# Patient Record
Sex: Female | Born: 1970 | Race: Black or African American | Hispanic: No | Smoking: Never smoker
Health system: Southern US, Community
[De-identification: ages and names within clinical notes are randomized; demographics above are authoritative.]

## PROBLEM LIST (undated history)

## (undated) DIAGNOSIS — R51 Headache: Secondary | ICD-10-CM

## (undated) DIAGNOSIS — D649 Anemia, unspecified: Secondary | ICD-10-CM

## (undated) DIAGNOSIS — R519 Headache, unspecified: Secondary | ICD-10-CM

## (undated) DIAGNOSIS — G500 Trigeminal neuralgia: Secondary | ICD-10-CM

## (undated) HISTORY — DX: Trigeminal neuralgia: G50.0

## (undated) HISTORY — DX: Headache, unspecified: R51.9

## (undated) HISTORY — DX: Headache: R51

## (undated) HISTORY — PX: TUBAL LIGATION: SHX77

## (undated) HISTORY — DX: Anemia, unspecified: D64.9

---

## 2001-04-03 ENCOUNTER — Other Ambulatory Visit: Admission: RE | Admit: 2001-04-03 | Discharge: 2001-04-03 | Payer: Self-pay | Admitting: Family Medicine

## 2002-04-02 ENCOUNTER — Emergency Department (HOSPITAL_COMMUNITY): Admission: EM | Admit: 2002-04-02 | Discharge: 2002-04-02 | Payer: Self-pay | Admitting: Emergency Medicine

## 2002-08-30 ENCOUNTER — Encounter: Payer: Self-pay | Admitting: Emergency Medicine

## 2002-08-31 ENCOUNTER — Inpatient Hospital Stay (HOSPITAL_COMMUNITY): Admission: EM | Admit: 2002-08-31 | Discharge: 2002-09-04 | Payer: Self-pay | Admitting: Emergency Medicine

## 2002-08-31 ENCOUNTER — Encounter: Payer: Self-pay | Admitting: Internal Medicine

## 2002-09-01 ENCOUNTER — Encounter: Payer: Self-pay | Admitting: General Surgery

## 2002-09-02 ENCOUNTER — Encounter: Payer: Self-pay | Admitting: General Surgery

## 2002-09-10 ENCOUNTER — Encounter: Payer: Self-pay | Admitting: Internal Medicine

## 2002-09-10 ENCOUNTER — Ambulatory Visit (HOSPITAL_COMMUNITY): Admission: RE | Admit: 2002-09-10 | Discharge: 2002-09-10 | Payer: Self-pay | Admitting: Internal Medicine

## 2002-11-08 ENCOUNTER — Encounter: Payer: Self-pay | Admitting: Internal Medicine

## 2002-11-08 ENCOUNTER — Ambulatory Visit (HOSPITAL_COMMUNITY): Admission: RE | Admit: 2002-11-08 | Discharge: 2002-11-08 | Payer: Self-pay | Admitting: Internal Medicine

## 2004-01-19 ENCOUNTER — Ambulatory Visit (HOSPITAL_COMMUNITY): Admission: RE | Admit: 2004-01-19 | Discharge: 2004-01-19 | Payer: Self-pay | Admitting: Family Medicine

## 2013-12-30 ENCOUNTER — Other Ambulatory Visit: Payer: Self-pay

## 2013-12-30 DIAGNOSIS — Z1231 Encounter for screening mammogram for malignant neoplasm of breast: Secondary | ICD-10-CM

## 2014-02-07 ENCOUNTER — Ambulatory Visit: Payer: Self-pay

## 2014-03-30 ENCOUNTER — Encounter: Payer: Self-pay | Admitting: *Deleted

## 2014-04-19 ENCOUNTER — Ambulatory Visit: Payer: PRIVATE HEALTH INSURANCE | Admitting: Family Medicine

## 2014-05-24 ENCOUNTER — Ambulatory Visit (INDEPENDENT_AMBULATORY_CARE_PROVIDER_SITE_OTHER): Payer: PRIVATE HEALTH INSURANCE | Admitting: Family Medicine

## 2014-05-24 ENCOUNTER — Encounter: Payer: Self-pay | Admitting: Family Medicine

## 2014-05-24 VITALS — BP 130/82 | HR 78 | Temp 98.3°F | Resp 12 | Ht 69.0 in | Wt 281.0 lb

## 2014-05-24 DIAGNOSIS — E559 Vitamin D deficiency, unspecified: Secondary | ICD-10-CM

## 2014-05-24 DIAGNOSIS — K219 Gastro-esophageal reflux disease without esophagitis: Secondary | ICD-10-CM | POA: Insufficient documentation

## 2014-05-24 DIAGNOSIS — E669 Obesity, unspecified: Secondary | ICD-10-CM

## 2014-05-24 DIAGNOSIS — R7301 Impaired fasting glucose: Secondary | ICD-10-CM

## 2014-05-24 DIAGNOSIS — Z1231 Encounter for screening mammogram for malignant neoplasm of breast: Secondary | ICD-10-CM

## 2014-05-24 DIAGNOSIS — R0789 Other chest pain: Secondary | ICD-10-CM

## 2014-05-24 DIAGNOSIS — Z0000 Encounter for general adult medical examination without abnormal findings: Secondary | ICD-10-CM

## 2014-05-24 LAB — COMPREHENSIVE METABOLIC PANEL
ALT: 12 U/L (ref 0–35)
AST: 14 U/L (ref 0–37)
Albumin: 3.9 g/dL (ref 3.5–5.2)
Alkaline Phosphatase: 51 U/L (ref 39–117)
BILIRUBIN TOTAL: 0.4 mg/dL (ref 0.2–1.2)
BUN: 11 mg/dL (ref 6–23)
CO2: 27 mEq/L (ref 19–32)
CREATININE: 0.81 mg/dL (ref 0.50–1.10)
Calcium: 9.6 mg/dL (ref 8.4–10.5)
Chloride: 103 mEq/L (ref 96–112)
GLUCOSE: 84 mg/dL (ref 70–99)
Potassium: 4.6 mEq/L (ref 3.5–5.3)
Sodium: 138 mEq/L (ref 135–145)
Total Protein: 7.2 g/dL (ref 6.0–8.3)

## 2014-05-24 LAB — CBC WITH DIFFERENTIAL/PLATELET
Basophils Absolute: 0.1 10*3/uL (ref 0.0–0.1)
Basophils Relative: 1 % (ref 0–1)
EOS ABS: 0 10*3/uL (ref 0.0–0.7)
EOS PCT: 0 % (ref 0–5)
HCT: 34.6 % — ABNORMAL LOW (ref 36.0–46.0)
Hemoglobin: 12 g/dL (ref 12.0–15.0)
Lymphocytes Relative: 23 % (ref 12–46)
Lymphs Abs: 1.3 10*3/uL (ref 0.7–4.0)
MCH: 31.4 pg (ref 26.0–34.0)
MCHC: 34.7 g/dL (ref 30.0–36.0)
MCV: 90.6 fL (ref 78.0–100.0)
Monocytes Absolute: 0.3 10*3/uL (ref 0.1–1.0)
Monocytes Relative: 5 % (ref 3–12)
Neutro Abs: 4 10*3/uL (ref 1.7–7.7)
Neutrophils Relative %: 71 % (ref 43–77)
PLATELETS: 331 10*3/uL (ref 150–400)
RBC: 3.82 MIL/uL — AB (ref 3.87–5.11)
RDW: 13.5 % (ref 11.5–15.5)
WBC: 5.7 10*3/uL (ref 4.0–10.5)

## 2014-05-24 LAB — LIPID PANEL
Cholesterol: 144 mg/dL (ref 0–200)
HDL: 65 mg/dL (ref >39)
LDL Cholesterol: 68 mg/dL (ref 0–99)
TRIGLYCERIDES: 57 mg/dL (ref <150)
Total CHOL/HDL Ratio: 2.2 Ratio
VLDL: 11 mg/dL (ref 0–40)

## 2014-05-24 LAB — HEMOGLOBIN A1C
HEMOGLOBIN A1C: 5.3 % (ref <5.7)
MEAN PLASMA GLUCOSE: 105 mg/dL (ref <117)

## 2014-05-24 NOTE — Progress Notes (Signed)
Patient ID: Beverly Mclaughlin, female   DOB: June 06, 1971, 43 y.o.   MRN: [id number]   Subjective:    Patient ID: Beverly Guntheramar Spieler, female    DOB: June 06, 1971, 43 y.o.   MRN: [id number]  Patient presents for New Patient CPE  patient here for new physical exam. Her previous PCP was Dr. Mirna MiresGerald Hill she is [facility] [place]. Her Pap smear and mammogram are up-to-date. She did have a D&C secondary to a polyp but this was benign she also has 2 small fibroids.  Obesity she has lost about 20 pounds over the past year her heaviest is 301 pounds. She exercises 3 times a week and is change her diet. She has had fasting labs last year with her job which is slightly elevated glucose of 110 she is due for repeat labs today. She also has a history of vitamin D deficiency and some mild anemia which was treated both with vitamin therapy   Her only complaint today is Naprosyn chest pain last week she's had acid reflux in the past but was not sure of this was reflux or not she was concerned because of a family history of heart disease. The pain felt like more pressure in the middle of her chest and went up into her esophagus region she's not having shortness of breath diaphoresis nausea vomiting associated with very short lived. In the past she has taken over-the-counter medications such as Prilosec.  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    Review Of Systems:  GEN- denies fatigue, fever, weight loss,weakness, recent illness HEENT- denies eye drainage, change in vision, nasal discharge, CVS- denies chest pain, palpitations RESP- denies SOB, cough, wheeze ABD- denies N/V, change in stools, abd pain GU- denies dysuria, hematuria, dribbling, incontinence MSK- denies joint pain, muscle aches, injury Neuro- denies headache, dizziness, syncope, seizure activity       Objective:    BP 130/82  Pulse 78   Temp(Src) 98.3 F (36.8 C) (Oral)  Resp 12  Ht 5\' 9"  (1.753 m)  Wt 281 lb (127.461 kg)  BMI 41.48 kg/m2 GEN- NAD, alert and oriented x3 HEENT- PERRL, EOMI, non injected sclera, pink conjunctiva, MMM, oropharynx clear, TM clear bilat , canals clear Neck- Supple, no thyromegaly CVS- RRR, no murmur RESP-CTAB ABD-NABS,soft,NT,ND EXT- No edema Pulses- Radial, DP- 2+ Psych- normal affect and mood  EKG-NSR, no ST changes       Assessment & Plan:      Problem List Items Addressed This Visit   None      Note: This dictation was prepared with Dragon dictation along with smaller phrase technology. Any transcriptional errors that result from this process are unintentional.

## 2014-05-24 NOTE — Assessment & Plan Note (Signed)
GYN deferred I will obtain records Immunizations are up to date she will get flu shot in the fall Fasting labs to be done today she will continue to work on her weight loss with her dietary changes. Continue vitamin supplement She has establish with the dentist as well as an eye doctor

## 2014-05-24 NOTE — Assessment & Plan Note (Signed)
Check A1C 

## 2014-05-24 NOTE — Patient Instructions (Signed)
We will call with lab results if abnormal I recommend eye visit once a year I recommend dental visit every 6 months Goal is to  Exercise 30 minutes 5 days a week Try the over the counter Zantac or prilosec F/U as needed or in 1 year

## 2014-05-24 NOTE — Assessment & Plan Note (Signed)
I think her chest pain is secondary to acid reflux was a very short-lived episode. She can use over-the-counter medications

## 2014-05-25 ENCOUNTER — Encounter: Payer: Self-pay | Admitting: *Deleted

## 2014-05-25 LAB — VITAMIN D 25 HYDROXY (VIT D DEFICIENCY, FRACTURES): Vit D, 25-Hydroxy: 40 ng/mL (ref 30–89)

## 2014-06-13 ENCOUNTER — Ambulatory Visit
Admission: RE | Admit: 2014-06-13 | Discharge: 2014-06-13 | Disposition: A | Discharge status: Home / Self Care | Payer: PRIVATE HEALTH INSURANCE | Source: Ambulatory Visit | Attending: Family Medicine | Admitting: Family Medicine

## 2014-06-13 ENCOUNTER — Ambulatory Visit: Payer: Self-pay

## 2014-06-13 DIAGNOSIS — Z1231 Encounter for screening mammogram for malignant neoplasm of breast: Secondary | ICD-10-CM

## 2014-06-21 ENCOUNTER — Encounter: Payer: Self-pay | Admitting: Family Medicine

## 2014-09-16 ENCOUNTER — Ambulatory Visit: Payer: PRIVATE HEALTH INSURANCE | Admitting: Family Medicine

## 2014-10-06 ENCOUNTER — Ambulatory Visit (INDEPENDENT_AMBULATORY_CARE_PROVIDER_SITE_OTHER): Payer: PRIVATE HEALTH INSURANCE | Admitting: Physician Assistant

## 2014-10-06 ENCOUNTER — Encounter: Payer: Self-pay | Admitting: Physician Assistant

## 2014-10-06 DIAGNOSIS — R8299 Other abnormal findings in urine: Secondary | ICD-10-CM

## 2014-10-06 DIAGNOSIS — Z23 Encounter for immunization: Secondary | ICD-10-CM

## 2014-10-06 DIAGNOSIS — R8290 Unspecified abnormal findings in urine: Secondary | ICD-10-CM

## 2014-10-06 LAB — URINALYSIS, ROUTINE W REFLEX MICROSCOPIC
BILIRUBIN URINE: NEGATIVE
Glucose, UA: NEGATIVE mg/dL
HGB URINE DIPSTICK: NEGATIVE
Leukocytes, UA: NEGATIVE
Nitrite: NEGATIVE
PH: 6 (ref 5.0–8.0)
PROTEIN: NEGATIVE mg/dL
Specific Gravity, Urine: 1.01 (ref 1.005–1.030)
Urobilinogen, UA: 0.2 mg/dL (ref 0.0–1.0)

## 2014-10-06 NOTE — Progress Notes (Signed)
    Patient ID: Beverly Mclaughlin MRN: [id number], DOB: 1971/01/13, 43 y.o. Date of Encounter: 10/06/2014, 3:52 PM    Chief Complaint:  Chief Complaint  Patient presents with  . cloudy urine mild dysuria     HPI: 43 y.o. year old AA Obese female says that when she urinated today she noticed it looked a little bit cloudy. Says she really has had no discomfort. No dysuria. No vaginal irritation or itching. No vaginal discharge. Is sexuyally active but in relationship with one person long time. No pelvic pain, no fever/chills.  Said she just wanted to check to make sure no urine infection.      Home Meds:   Outpatient Prescriptions Prior to Visit  Medication Sig Dispense Refill  . ibuprofen (ADVIL,MOTRIN) 200 MG tablet Take 200 mg by mouth every 6 (six) hours as needed.    . Multiple Vitamins-Calcium (ONE-A-DAY WOMENS FORMULA) TABS Take 1 tablet by mouth every morning.     No facility-administered medications prior to visit.    Allergies: No Known Allergies    Review of Systems: See HPI for pertinent ROS. All other ROS negative.    Physical Exam: Blood pressure 138/96, pulse 92, temperature 97.9 F (36.6 C), temperature source Oral, resp. rate 18, weight 270 lb (122.471 kg)., Body mass index is 39.85 kg/(m^2). General:  Obese AAF. Appears in no acute distress. Neck: Supple. No thyromegaly. No lymphadenopathy. Lungs: Clear bilaterally to auscultation without wheezes, rales, or rhonchi. Breathing is unlabored. Heart: Regular rhythm. No murmurs, rubs, or gallops. Abdomen: Soft, non-tender, non-distended with normoactive bowel sounds. No hepatomegaly. No rebound/guarding. No obvious abdominal masses. Msk:  Strength and tone normal for age. No tenderness with percussion of costophrenic angles bilaterally.  Extremities/Skin: Warm and dry. Neuro: Alert and oriented X 3. Moves all extremities spontaneously. Gait is normal. CNII-XII grossly in tact. Psych:  Responds to questions  appropriately with a normal affect.   Results for orders placed or performed in visit on 10/06/14  Urinalysis, Routine w reflex microscopic  Result Value Ref Range   Color, Urine YELLOW YELLOW   APPearance CLEAR CLEAR   Specific Gravity, Urine 1.010 1.005 - 1.030   pH 6.0 5.0 - 8.0   Glucose, UA NEG NEG mg/dL   Bilirubin Urine NEG NEG   Ketones, ur TRACE (A) NEG mg/dL   Hgb urine dipstick NEG NEG   Protein, ur NEG NEG mg/dL   Urobilinogen, UA 0.2 0.0 - 1.0 mg/dL   Nitrite NEG NEG   Leukocytes, UA NEG NEG     ASSESSMENT AND PLAN:  43 y.o. year old female with  1. Cloudy urine - Urinalysis, Routine w reflex microscopic  Reassured her that her urine is normal. No infection.   2. Need for prophylactic vaccination and inoculation against influenza - Flu Vaccine QUAD 36+ mos IM   Signed, [address] [place], [place], [facility] 10/06/2014 3:52 PM

## 2015-05-31 ENCOUNTER — Other Ambulatory Visit: Payer: PRIVATE HEALTH INSURANCE

## 2015-05-31 DIAGNOSIS — E669 Obesity, unspecified: Secondary | ICD-10-CM

## 2015-05-31 DIAGNOSIS — Z0000 Encounter for general adult medical examination without abnormal findings: Secondary | ICD-10-CM

## 2015-05-31 LAB — CBC WITH DIFFERENTIAL/PLATELET
BASOS PCT: 1 % (ref 0–1)
Basophils Absolute: 0.1 10*3/uL (ref 0.0–0.1)
EOS ABS: 0.1 10*3/uL (ref 0.0–0.7)
EOS PCT: 1 % (ref 0–5)
HCT: 35.5 % — ABNORMAL LOW (ref 36.0–46.0)
Hemoglobin: 11.9 g/dL — ABNORMAL LOW (ref 12.0–15.0)
LYMPHS PCT: 25 % (ref 12–46)
Lymphs Abs: 1.3 10*3/uL (ref 0.7–4.0)
MCH: 31.5 pg (ref 26.0–34.0)
MCHC: 33.5 g/dL (ref 30.0–36.0)
MCV: 93.9 fL (ref 78.0–100.0)
MONO ABS: 0.3 10*3/uL (ref 0.1–1.0)
MPV: 9.4 fL (ref 8.6–12.4)
Monocytes Relative: 6 % (ref 3–12)
Neutro Abs: 3.4 10*3/uL (ref 1.7–7.7)
Neutrophils Relative %: 67 % (ref 43–77)
PLATELETS: 320 10*3/uL (ref 150–400)
RBC: 3.78 MIL/uL — AB (ref 3.87–5.11)
RDW: 13 % (ref 11.5–15.5)
WBC: 5 10*3/uL (ref 4.0–10.5)

## 2015-05-31 LAB — COMPLETE METABOLIC PANEL WITH GFR
ALT: 8 U/L (ref 6–29)
AST: 14 U/L (ref 10–35)
Albumin: 3.6 g/dL (ref 3.6–5.1)
Alkaline Phosphatase: 43 U/L (ref 33–115)
BUN: 7 mg/dL (ref 7–25)
CHLORIDE: 102 mmol/L (ref 98–110)
CO2: 26 mmol/L (ref 20–31)
Calcium: 9.2 mg/dL (ref 8.6–10.2)
Creat: 0.75 mg/dL (ref 0.50–1.10)
GFR, Est African American: >89 mL/min (ref >=60)
GFR, Est Non African American: >89 mL/min (ref >=60)
Glucose, Bld: 83 mg/dL (ref 70–99)
POTASSIUM: 4.4 mmol/L (ref 3.5–5.3)
Sodium: 137 mmol/L (ref 135–146)
Total Bilirubin: 0.4 mg/dL (ref 0.2–1.2)
Total Protein: 6.7 g/dL (ref 6.1–8.1)

## 2015-05-31 LAB — LIPID PANEL
CHOLESTEROL: 129 mg/dL (ref 125–200)
HDL: 64 mg/dL (ref >=46)
LDL Cholesterol: 54 mg/dL (ref <130)
Total CHOL/HDL Ratio: 2 Ratio (ref <=5.0)
Triglycerides: 57 mg/dL (ref <150)
VLDL: 11 mg/dL (ref <30)

## 2015-05-31 LAB — TSH: TSH: 0.974 u[IU]/mL (ref 0.350–4.500)

## 2015-06-07 ENCOUNTER — Ambulatory Visit (INDEPENDENT_AMBULATORY_CARE_PROVIDER_SITE_OTHER): Payer: PRIVATE HEALTH INSURANCE | Admitting: Family Medicine

## 2015-06-07 ENCOUNTER — Encounter: Payer: Self-pay | Admitting: Family Medicine

## 2015-06-07 VITALS — BP 114/70 | HR 68 | Temp 98.8°F | Resp 16 | Ht 69.0 in | Wt 243.0 lb

## 2015-06-07 DIAGNOSIS — Z0000 Encounter for general adult medical examination without abnormal findings: Secondary | ICD-10-CM | POA: Diagnosis not present

## 2015-06-07 DIAGNOSIS — E669 Obesity, unspecified: Secondary | ICD-10-CM | POA: Diagnosis not present

## 2015-06-07 NOTE — Assessment & Plan Note (Signed)
Signigicant weight loss noted, continue healthy eating and exercise

## 2015-06-07 NOTE — Assessment & Plan Note (Signed)
CPE done, prevention UTD, immunizations UTD, fasting labs reviewed

## 2015-06-07 NOTE — Progress Notes (Signed)
Patient ID: Beverly Mclaughlin, female   DOB: 07/09/1971, 44 y.o.   MRN: [id number]   Subjective:    Patient ID: Beverly Mclaughlin, female    DOB: 1971/09/14, 44 y.o.   MRN: [id number]  Patient presents for CPE- no PAP  patient for annual physical exam. She has no particular concerns today. She is up-to-date with her GYN she has been having some irregular bleeding and also has a prolapsed uterus therefore she has not ultrasound schedule in the next month and they're considering hysterectomy. She still working out 6 days a week and monitoring her diet. She has lost approximately 58 pounds. She is not taking any prescription medications. Her fasting last saw reviewed. Of note her family history is updated that her mother has multiple myeloma    Review Of Systems:  GEN- denies fatigue, fever, weight loss,weakness, recent illness HEENT- denies eye drainage, change in vision, nasal discharge, CVS- denies chest pain, palpitations RESP- denies SOB, cough, wheeze ABD- denies N/V, change in stools, abd pain GU- denies dysuria, hematuria, dribbling, incontinence MSK- denies joint pain, muscle aches, injury Neuro- denies headache, dizziness, syncope, seizure activity       Objective:    BP 114/70 mmHg  Pulse 68  Temp(Src) 98.8 F (37.1 C) (Oral)  Resp 16  Ht $R'5\' 9"'vM$  (1.753 m)  Wt 243 lb (110.224 kg)  BMI 35.87 kg/m2  LMP 05/17/2015 (Approximate) GEN- NAD, alert and oriented x3 HEENT- PERRL, EOMI, non injected sclera, pink conjunctiva, MMM, oropharynx clear Neck- Supple, no thyromegaly CVS- RRR, no murmur RESP-CTAB ABD-NABS,soft,NT,ND EXT- No edema Pulses- Radial, DP- 2+        Assessment & Plan:      Problem List Items Addressed This Visit    Routine general medical examination at a health care facility - Primary    CPE done, prevention UTD, immunizations UTD, fasting labs reviewed      Obesity    Signigicant weight loss noted, continue healthy eating and exercise         Note:  This dictation was prepared with Dragon dictation along with smaller phrase technology. Any transcriptional errors that result from this process are unintentional.

## 2015-06-07 NOTE — Patient Instructions (Signed)
I recommend eye visit once a year I recommend dental visit every 6 months Goal is to  Exercise 30 minutes 5 days a week F/U 1 year for physical

## 2015-08-21 ENCOUNTER — Other Ambulatory Visit: Payer: Self-pay

## 2015-08-21 DIAGNOSIS — Z1231 Encounter for screening mammogram for malignant neoplasm of breast: Secondary | ICD-10-CM

## 2015-08-25 ENCOUNTER — Ambulatory Visit
Admission: RE | Admit: 2015-08-25 | Discharge: 2015-08-25 | Disposition: A | Discharge status: Home / Self Care | Payer: PRIVATE HEALTH INSURANCE | Source: Ambulatory Visit

## 2015-08-25 DIAGNOSIS — Z1231 Encounter for screening mammogram for malignant neoplasm of breast: Secondary | ICD-10-CM

## 2015-10-29 HISTORY — PX: ABDOMINAL HYSTERECTOMY: SHX81

## 2015-11-29 ENCOUNTER — Emergency Department (HOSPITAL_COMMUNITY): Payer: PRIVATE HEALTH INSURANCE

## 2015-11-29 ENCOUNTER — Emergency Department (HOSPITAL_COMMUNITY)
Admission: EM | Admit: 2015-11-29 | Discharge: 2015-11-29 | Disposition: A | Discharge status: Home / Self Care | Payer: PRIVATE HEALTH INSURANCE | Attending: Emergency Medicine | Admitting: Emergency Medicine

## 2015-11-29 ENCOUNTER — Encounter (HOSPITAL_COMMUNITY): Payer: Self-pay

## 2015-11-29 DIAGNOSIS — Z79899 Other long term (current) drug therapy: Secondary | ICD-10-CM | POA: Diagnosis not present

## 2015-11-29 DIAGNOSIS — Z862 Personal history of diseases of the blood and blood-forming organs and certain disorders involving the immune mechanism: Secondary | ICD-10-CM | POA: Diagnosis not present

## 2015-11-29 DIAGNOSIS — R079 Chest pain, unspecified: Secondary | ICD-10-CM | POA: Diagnosis not present

## 2015-11-29 LAB — BASIC METABOLIC PANEL
ANION GAP: 10 (ref 5–15)
BUN: 10 mg/dL (ref 6–20)
CALCIUM: 9.2 mg/dL (ref 8.9–10.3)
CHLORIDE: 101 mmol/L (ref 101–111)
CO2: 26 mmol/L (ref 22–32)
Creatinine, Ser: 0.71 mg/dL (ref 0.44–1.00)
GFR calc non Af Amer: >60 mL/min (ref >60)
Glucose, Bld: 95 mg/dL (ref 65–99)
POTASSIUM: 3.8 mmol/L (ref 3.5–5.1)
Sodium: 137 mmol/L (ref 135–145)

## 2015-11-29 LAB — CBC WITH DIFFERENTIAL/PLATELET
BASOS ABS: 0 10*3/uL (ref 0.0–0.1)
BASOS PCT: 1 %
Eosinophils Absolute: 0 10*3/uL (ref 0.0–0.7)
Eosinophils Relative: 0 %
HEMATOCRIT: 37.4 % (ref 36.0–46.0)
HEMOGLOBIN: 12.1 g/dL (ref 12.0–15.0)
Lymphocytes Relative: 16 %
Lymphs Abs: 1.4 10*3/uL (ref 0.7–4.0)
MCH: 31.7 pg (ref 26.0–34.0)
MCHC: 32.4 g/dL (ref 30.0–36.0)
MCV: 97.9 fL (ref 78.0–100.0)
Monocytes Absolute: 0.4 10*3/uL (ref 0.1–1.0)
Monocytes Relative: 4 %
NEUTROS ABS: 6.9 10*3/uL (ref 1.7–7.7)
NEUTROS PCT: 79 %
Platelets: 361 10*3/uL (ref 150–400)
RBC: 3.82 MIL/uL — AB (ref 3.87–5.11)
RDW: 12.5 % (ref 11.5–15.5)
WBC: 8.7 10*3/uL (ref 4.0–10.5)

## 2015-11-29 LAB — TROPONIN I: Troponin I: <0.03 ng/mL (ref <0.031)

## 2015-11-29 LAB — D-DIMER, QUANTITATIVE (NOT AT ARMC): D DIMER QUANT: 1.33 ug{FEU}/mL — AB (ref 0.00–0.50)

## 2015-11-29 MED ORDER — IBUPROFEN 800 MG PO TABS: 800.0000 mg | ORAL_TABLET | Freq: Three times a day (TID) | ORAL | Status: DC

## 2015-11-29 MED ORDER — IOHEXOL 350 MG/ML SOLN
100.0000 mL | Freq: Once | INTRAVENOUS | Status: AC | PRN
  Administered 2015-11-29: 100 mL via INTRAVENOUS

## 2015-11-29 NOTE — Discharge Instructions (Signed)

## 2015-11-29 NOTE — ED Notes (Signed)
Pt ambulated to restroom with steady, even gait. Pt denied chest pain, SOB, dizziness when ambulating.

## 2015-11-29 NOTE — ED Provider Notes (Signed)
CSN: [id number]     Arrival date & time 11/29/15  1905 History   First MD Initiated Contact with Patient 11/29/15 1909     Chief Complaint  Patient presents with  . Chest Pain     (Consider location/radiation/quality/duration/timing/severity/associated sxs/prior Treatment) HPI  The patient is a 45 year old female, she has no known cardiac history or pulmonary embolism risk factors other than a hysterectomy which was a vaginal hysterectomy performed 2 weeks ago. She reports that over the last couple of days she has had intermittent chest discomfort which she describes as a heaviness, sometimes sharp, some radiation to the left arm and the back, it is not exertional, not positional, there is a slight increase in pain with deep breathing. There has been no swelling of her legs, today she was able to get out and walk around the block couple of times and felt great without any symptoms. At this time her symptoms are mild but persistent, no fevers chills coughing. She does have occasional shortness of breath.  Past Medical History  Diagnosis Date  . Anemia    Past Surgical History  Procedure Laterality Date  . Tubal ligation    . Abdominal hysterectomy      uterus only,    Family History  Problem Relation Age of Onset  . Heart disease Mother   . Diabetes Mother   . Diabetes Father   . Kidney disease Father   . Hypertension Sister   . Cancer Maternal Grandmother   . Kidney disease Maternal Grandmother   . Diabetes Paternal Grandfather   . Multiple myeloma Mother    Social History  Substance Use Topics  . Smoking status: Never Smoker   . Smokeless tobacco: Never Used  . Alcohol Use: 1.2 oz/week    1 Glasses of wine, 1 Cans of beer per week     Comment: occasionally   OB History    No data available     Review of Systems  All other systems reviewed and are negative.     Allergies  Prilosec  Home Medications   Prior to Admission medications   Medication Sig Start Date  End Date Taking? Authorizing Provider  Multiple Vitamin (MULTI-VITAMINS) TABS Take 1 tablet by mouth daily.   Yes Historical Provider, MD  naproxen (NAPROSYN) 500 MG tablet Take 1 tablet by mouth every 8 (eight) hours as needed. 11/09/15  Yes Historical Provider, MD  omeprazole (PRILOSEC) 20 MG capsule Take 20 mg by mouth once as needed.   Yes Historical Provider, MD  oxyCODONE-acetaminophen (PERCOCET/ROXICET) 5-325 MG tablet Take 1-2 tablets by mouth every 4 (four) hours as needed. 11/09/15  Yes Historical Provider, MD  ibuprofen (ADVIL,MOTRIN) 800 MG tablet Take 1 tablet (800 mg total) by mouth 3 (three) times daily. 11/29/15   Noemi Chapel, MD   BP 123/75 mmHg  Pulse 78  Temp(Src) 98.4 F (36.9 C) (Oral)  Resp 17  SpO2 100%  LMP 11/15/2015 Physical Exam  Constitutional: She appears well-developed and well-nourished. No distress.  HENT:  Head: Normocephalic and atraumatic.  Mouth/Throat: Oropharynx is clear and moist. No oropharyngeal exudate.  Eyes: Conjunctivae and EOM are normal. Pupils are equal, round, and reactive to light. Right eye exhibits no discharge. Left eye exhibits no discharge. No scleral icterus.  Neck: Normal range of motion. Neck supple. No JVD present. No thyromegaly present.  Cardiovascular: Normal rate, regular rhythm, normal heart sounds and intact distal pulses.  Exam reveals no gallop and no friction rub.   No  murmur heard. Pulmonary/Chest: Effort normal and breath sounds normal. No respiratory distress. She has no wheezes. She has no rales.  Abdominal: Soft. Bowel sounds are normal. She exhibits no distension and no mass. There is no tenderness.  Musculoskeletal: Normal range of motion. She exhibits no edema or tenderness.  Lymphadenopathy:    She has no cervical adenopathy.  Neurological: She is alert. Coordination normal.  Skin: Skin is warm and dry. No rash noted. No erythema.  Psychiatric: She has a normal mood and affect. Her behavior is normal.  Nursing  note and vitals reviewed.   ED Course  Procedures (including critical care time) Labs Review Labs Reviewed  CBC WITH DIFFERENTIAL/PLATELET - Abnormal; Notable for the following:    RBC 3.82 (*)    All other components within normal limits  D-DIMER, QUANTITATIVE (NOT AT [facility]) - Abnormal; Notable for the following:    D-Dimer, Quant 1.33 (*)    All other components within normal limits  BASIC METABOLIC PANEL  TROPONIN I    Imaging Review Dg Chest 2 View  11/29/2015  CLINICAL DATA:  Chest pain EXAM: CHEST  2 VIEW COMPARISON:  None FINDINGS: Normal heart size. No pleural effusion or edema. No airspace consolidation identified. Chronic bilateral rib fracture deformities are noted. IMPRESSION: 1. No acute cardiopulmonary abnormalities. Electronically Signed   By: Kerby Moors M.D.   On: 11/29/2015 20:06   Ct Angio Chest Pe W/cm &/or Wo Cm  11/29/2015  CLINICAL DATA:  45 year old female with chest pain and nausea since yesterday. EXAM: CT ANGIOGRAPHY CHEST WITH CONTRAST TECHNIQUE: Multidetector CT imaging of the chest was performed using the standard protocol during bolus administration of intravenous contrast. Multiplanar CT image reconstructions and MIPs were obtained to evaluate the vascular anatomy. CONTRAST:  160m OMNIPAQUE IOHEXOL 350 MG/ML SOLN COMPARISON:  No priors. FINDINGS: Mediastinum/Lymph Nodes: No filling defects within the pulmonary arterial tree to suggest underlying pulmonary embolism. Heart size is normal. There is no significant pericardial fluid, thickening or pericardial calcification. No pathologically enlarged mediastinal or hilar lymph nodes. Esophagus is normal in appearance. No axillary lymphadenopathy. Lungs/Pleura: No consolidative airspace disease. No pleural effusions. No pneumothorax. No suspicious appearing pulmonary nodules or masses. Upper Abdomen: Visualized portions are unremarkable. Musculoskeletal/Soft Tissues: Multiple old healed bilateral posterior rib  fractures incidentally noted. There are no aggressive appearing lytic or blastic lesions noted in the visualized portions of the skeleton. Review of the MIP images confirms the above findings. IMPRESSION: 1. No evidence of pulmonary embolism. 2. No acute findings in the thorax to account for the patient's symptoms. Electronically Signed   By: DVinnie LangtonM.D.   On: 11/29/2015 21:29   I have personally reviewed and evaluated these images and lab results as part of my medical decision-making.   EKG Interpretation   Date/Time:  Wednesday November 29 2015 19:12:01 EST Ventricular Rate:  80 PR Interval:  160 QRS Duration: 86 QT Interval:  346 QTC Calculation: 399 R Axis:   22 Text Interpretation:  Sinus rhythm Baseline wander in lead(s) I ECG  OTHERWISE WITHIN NORMAL LIMITS No old tracing to compare Confirmed by  Kaliyan Osbourn  MD, Perle Brickhouse ([id number] on 11/29/2015 7:17:12 PM      MDM   Final diagnoses:  Chest pain, unspecified chest pain type    The patient does have some reproducible tenderness in her chest but states this is different than what she has been feeling. She has no peripheral edema, no tachycardia, no hypoxia, no fever. We'll obtain d-dimer, labs, chest  x-ray, may need CT scan to rule out pulmonary embolism, I do not think this is an aortic dissection, it does not sound to be ischemic in nature.  The patient's CT scan shows no signs of acute pulmonary embolism, she has normal labs otherwise, her vital signs are been totally normal, she is stable for discharge. The patient is in agreement with the plan, she has expressed her understanding to the indications for return, she has been given all of her results and verbal form.  In addition to written d/c instructions, the pt was given verbal d/c instructions including the indications for return and expressed understanding to the instructions.   Noemi Chapel, MD 11/29/15 2206

## 2015-11-29 NOTE — ED Notes (Signed)
MD at bedside. 

## 2015-11-29 NOTE — ED Notes (Signed)
Having chest pain with nausea that started yesterday. 2 weeks post vaginal hysterectomy.

## 2015-11-30 ENCOUNTER — Encounter (HOSPITAL_COMMUNITY): Payer: Self-pay | Admitting: Emergency Medicine

## 2015-11-30 ENCOUNTER — Emergency Department (HOSPITAL_COMMUNITY)
Admission: EM | Admit: 2015-11-30 | Discharge: 2015-11-30 | Disposition: A | Discharge status: Home / Self Care | Payer: PRIVATE HEALTH INSURANCE | Attending: Emergency Medicine | Admitting: Emergency Medicine

## 2015-11-30 ENCOUNTER — Other Ambulatory Visit: Payer: Self-pay

## 2015-11-30 DIAGNOSIS — D649 Anemia, unspecified: Secondary | ICD-10-CM | POA: Insufficient documentation

## 2015-11-30 DIAGNOSIS — K219 Gastro-esophageal reflux disease without esophagitis: Secondary | ICD-10-CM | POA: Insufficient documentation

## 2015-11-30 DIAGNOSIS — Z79899 Other long term (current) drug therapy: Secondary | ICD-10-CM | POA: Diagnosis not present

## 2015-11-30 DIAGNOSIS — Z791 Long term (current) use of non-steroidal anti-inflammatories (NSAID): Secondary | ICD-10-CM | POA: Insufficient documentation

## 2015-11-30 DIAGNOSIS — R1013 Epigastric pain: Secondary | ICD-10-CM | POA: Diagnosis present

## 2015-11-30 LAB — COMPREHENSIVE METABOLIC PANEL
ALBUMIN: 3.6 g/dL (ref 3.5–5.0)
ALT: 13 U/L — ABNORMAL LOW (ref 14–54)
ANION GAP: 8 (ref 5–15)
AST: 19 U/L (ref 15–41)
Alkaline Phosphatase: 62 U/L (ref 38–126)
BILIRUBIN TOTAL: 0.5 mg/dL (ref 0.3–1.2)
BUN: 8 mg/dL (ref 6–20)
CHLORIDE: 103 mmol/L (ref 101–111)
CO2: 28 mmol/L (ref 22–32)
CREATININE: 0.72 mg/dL (ref 0.44–1.00)
Calcium: 9.4 mg/dL (ref 8.9–10.3)
GFR calc Af Amer: >60 mL/min (ref >60)
GFR calc non Af Amer: >60 mL/min (ref >60)
Glucose, Bld: 104 mg/dL — ABNORMAL HIGH (ref 65–99)
POTASSIUM: 4.6 mmol/L (ref 3.5–5.1)
SODIUM: 139 mmol/L (ref 135–145)
TOTAL PROTEIN: 7.2 g/dL (ref 6.5–8.1)

## 2015-11-30 LAB — CBC WITH DIFFERENTIAL/PLATELET
BASOS ABS: 0 10*3/uL (ref 0.0–0.1)
BASOS PCT: 1 %
EOS ABS: 0 10*3/uL (ref 0.0–0.7)
EOS PCT: 0 %
HEMATOCRIT: 35.8 % — AB (ref 36.0–46.0)
Hemoglobin: 11.6 g/dL — ABNORMAL LOW (ref 12.0–15.0)
Lymphocytes Relative: 15 %
Lymphs Abs: 1.1 10*3/uL (ref 0.7–4.0)
MCH: 31.8 pg (ref 26.0–34.0)
MCHC: 32.4 g/dL (ref 30.0–36.0)
MCV: 98.1 fL (ref 78.0–100.0)
MONO ABS: 0.4 10*3/uL (ref 0.1–1.0)
MONOS PCT: 6 %
NEUTROS ABS: 5.6 10*3/uL (ref 1.7–7.7)
Neutrophils Relative %: 78 %
PLATELETS: 312 10*3/uL (ref 150–400)
RBC: 3.65 MIL/uL — ABNORMAL LOW (ref 3.87–5.11)
RDW: 12.5 % (ref 11.5–15.5)
WBC: 7.2 10*3/uL (ref 4.0–10.5)

## 2015-11-30 LAB — LIPASE, BLOOD: LIPASE: 37 U/L (ref 11–51)

## 2015-11-30 LAB — I-STAT TROPONIN, ED: TROPONIN I, POC: 0 ng/mL (ref 0.00–0.08)

## 2015-11-30 MED ORDER — LORAZEPAM 2 MG/ML IJ SOLN
0.5000 mg | Freq: Once | INTRAMUSCULAR | Status: AC
  Administered 2015-11-30: 0.5 mg via INTRAVENOUS
  Filled 2015-11-30: qty 1

## 2015-11-30 MED ORDER — HYDROMORPHONE HCL 1 MG/ML IJ SOLN
1.0000 mg | Freq: Once | INTRAMUSCULAR | Status: AC
  Administered 2015-11-30: 1 mg via INTRAVENOUS
  Filled 2015-11-30: qty 1

## 2015-11-30 MED ORDER — PANTOPRAZOLE SODIUM 40 MG IV SOLR
40.0000 mg | Freq: Once | INTRAVENOUS | Status: AC
  Administered 2015-11-30: 40 mg via INTRAVENOUS
  Filled 2015-11-30: qty 40

## 2015-11-30 MED ORDER — PANTOPRAZOLE SODIUM 20 MG PO TBEC: 20.0000 mg | DELAYED_RELEASE_TABLET | Freq: Every day | ORAL | Status: DC

## 2015-11-30 MED ORDER — TRAMADOL HCL 50 MG PO TABS: 50.0000 mg | ORAL_TABLET | Freq: Four times a day (QID) | ORAL | Status: DC | PRN

## 2015-11-30 NOTE — ED Provider Notes (Signed)
CSN: [id number]     Arrival date & time 11/30/15  9789 History   First MD Initiated Contact with Patient 11/30/15 [phone]     Chief Complaint  Patient presents with  . Chest Pain     (Consider location/radiation/quality/duration/timing/severity/associated sxs/prior Treatment) Patient is a 45 y.o. female presenting with chest pain. The history is provided by the patient (The patient complains of upper epigastric pain. She was seen yesterday had CT of her chest that showed no PE cardiac enzymes were negative yesterday also).  Chest Pain Pain location:  Epigastric Pain quality: aching   Pain radiates to:  Does not radiate Pain radiates to the back: yes   Pain severity:  Moderate Onset quality:  Sudden Timing:  Intermittent Progression:  Worsening Chronicity:  Recurrent Associated symptoms: abdominal pain   Associated symptoms: no back pain, no cough, no fatigue and no headache     Past Medical History  Diagnosis Date  . Anemia    Past Surgical History  Procedure Laterality Date  . Tubal ligation    . Abdominal hysterectomy      uterus only,    Family History  Problem Relation Age of Onset  . Heart disease Mother   . Diabetes Mother   . Diabetes Father   . Kidney disease Father   . Hypertension Sister   . Cancer Maternal Grandmother   . Kidney disease Maternal Grandmother   . Diabetes Paternal Grandfather   . Multiple myeloma Mother    Social History  Substance Use Topics  . Smoking status: Never Smoker   . Smokeless tobacco: Never Used  . Alcohol Use: 1.2 oz/week    1 Glasses of wine, 1 Cans of beer per week     Comment: occasionally   OB History    No data available     Review of Systems  Constitutional: Negative for appetite change and fatigue.  HENT: Negative for congestion, ear discharge and sinus pressure.   Eyes: Negative for discharge.  Respiratory: Negative for cough.   Cardiovascular: Positive for chest pain.  Gastrointestinal: Positive for abdominal  pain. Negative for diarrhea.  Genitourinary: Negative for frequency and hematuria.  Musculoskeletal: Negative for back pain.  Skin: Negative for rash.  Neurological: Negative for seizures and headaches.  Psychiatric/Behavioral: Negative for hallucinations.      Allergies  Prilosec  Home Medications   Prior to Admission medications   Medication Sig Start Date End Date Taking? Authorizing Provider  acetaminophen (TYLENOL) 500 MG tablet Take 1,000 mg by mouth every 6 (six) hours as needed.   Yes Historical Provider, MD  Cyanocobalamin (VITAMIN B-12 PO) Take 1 tablet by mouth daily.   Yes Historical Provider, MD  ibuprofen (ADVIL,MOTRIN) 800 MG tablet Take 1 tablet (800 mg total) by mouth 3 (three) times daily. 11/29/15  Yes Eber Hong, MD  Multiple Vitamin (MULTI-VITAMINS) TABS Take 1 tablet by mouth daily.   Yes Historical Provider, MD  naproxen (NAPROSYN) 500 MG tablet Take 1 tablet by mouth every 8 (eight) hours as needed. 11/09/15  Yes Historical Provider, MD  omeprazole (PRILOSEC) 20 MG capsule Take 20 mg by mouth once as needed.   Yes Historical Provider, MD  oxyCODONE-acetaminophen (PERCOCET/ROXICET) 5-325 MG tablet Take 1-2 tablets by mouth every 4 (four) hours as needed. 11/09/15  Yes Historical Provider, MD  pantoprazole (PROTONIX) 20 MG tablet Take 1 tablet (20 mg total) by mouth daily. 11/30/15   Bethann Berkshire, MD  traMADol (ULTRAM) 50 MG tablet Take 1 tablet (50 mg  total) by mouth every 6 (six) hours as needed. 11/30/15   Milton Ferguson, MD   BP 118/76 mmHg  Pulse 76  Temp(Src) 97.9 F (36.6 C) (Oral)  Resp 13  Ht '5\' 8"'$  (1.727 m)  Wt 232 lb (105.235 kg)  BMI 35.28 kg/m2  LMP 11/15/2015 Physical Exam  Constitutional: She is oriented to person, place, and time. She appears well-developed.  HENT:  Head: Normocephalic.  Eyes: Conjunctivae and EOM are normal. No scleral icterus.  Neck: Neck supple. No thyromegaly present.  Cardiovascular: Normal rate and regular rhythm.  Exam  reveals no gallop and no friction rub.   No murmur heard. Pulmonary/Chest: No stridor. She has no wheezes. She has no rales. She exhibits no tenderness.  Abdominal: She exhibits no distension. There is tenderness (tender epigastric area). There is no rebound.  Musculoskeletal: Normal range of motion. She exhibits no edema.  Lymphadenopathy:    She has no cervical adenopathy.  Neurological: She is oriented to person, place, and time. She exhibits normal muscle tone. Coordination normal.  Skin: No rash noted. No erythema.  Psychiatric: She has a normal mood and affect. Her behavior is normal.    ED Course  Procedures (including critical care time) Labs Review Labs Reviewed  CBC WITH DIFFERENTIAL/PLATELET - Abnormal; Notable for the following:    RBC 3.65 (*)    Hemoglobin 11.6 (*)    HCT 35.8 (*)    All other components within normal limits  COMPREHENSIVE METABOLIC PANEL - Abnormal; Notable for the following:    Glucose, Bld 104 (*)    ALT 13 (*)    All other components within normal limits  LIPASE, BLOOD  I-STAT TROPOININ, ED    Imaging Review Dg Chest 2 View  11/29/2015  CLINICAL DATA:  Chest pain EXAM: CHEST  2 VIEW COMPARISON:  None FINDINGS: Normal heart size. No pleural effusion or edema. No airspace consolidation identified. Chronic bilateral rib fracture deformities are noted. IMPRESSION: 1. No acute cardiopulmonary abnormalities. Electronically Signed   By: Kerby Moors M.D.   On: 11/29/2015 20:06   Ct Angio Chest Pe W/cm &/or Wo Cm  11/29/2015  CLINICAL DATA:  45 year old female with chest pain and nausea since yesterday. EXAM: CT ANGIOGRAPHY CHEST WITH CONTRAST TECHNIQUE: Multidetector CT imaging of the chest was performed using the standard protocol during bolus administration of intravenous contrast. Multiplanar CT image reconstructions and MIPs were obtained to evaluate the vascular anatomy. CONTRAST:  173m OMNIPAQUE IOHEXOL 350 MG/ML SOLN COMPARISON:  No priors.  FINDINGS: Mediastinum/Lymph Nodes: No filling defects within the pulmonary arterial tree to suggest underlying pulmonary embolism. Heart size is normal. There is no significant pericardial fluid, thickening or pericardial calcification. No pathologically enlarged mediastinal or hilar lymph nodes. Esophagus is normal in appearance. No axillary lymphadenopathy. Lungs/Pleura: No consolidative airspace disease. No pleural effusions. No pneumothorax. No suspicious appearing pulmonary nodules or masses. Upper Abdomen: Visualized portions are unremarkable. Musculoskeletal/Soft Tissues: Multiple old healed bilateral posterior rib fractures incidentally noted. There are no aggressive appearing lytic or blastic lesions noted in the visualized portions of the skeleton. Review of the MIP images confirms the above findings. IMPRESSION: 1. No evidence of pulmonary embolism. 2. No acute findings in the thorax to account for the patient's symptoms. Electronically Signed   By: DVinnie LangtonM.D.   On: 11/29/2015 21:29   I have personally reviewed and evaluated these images and lab results as part of my medical decision-making.   EKG Interpretation None  MDM   Final diagnoses:  GERD without esophagitis    Patient improved with protonic and pain medicine. She thinks the Prilosec would be causing some itching so we will change her to protonic and also give her an Ultram prescription and have her follow-up with her PCP    Milton Ferguson, MD 11/30/15 [phone]

## 2015-11-30 NOTE — ED Notes (Signed)
MD at bedside. 

## 2015-11-30 NOTE — ED Notes (Addendum)
Pt states that she has been having chest pain since Monday.  Was evaluated last night and dx with pleurisy.  Pt states that this morning around 5 the pain intensified and she became sweaty and short of breath so she returned.  Tylenol x2 taken at 0600.

## 2015-11-30 NOTE — Discharge Instructions (Signed)
Follow up with your family md next week.  Stop the prilosec

## 2015-12-04 ENCOUNTER — Ambulatory Visit (INDEPENDENT_AMBULATORY_CARE_PROVIDER_SITE_OTHER): Payer: PRIVATE HEALTH INSURANCE | Admitting: Family Medicine

## 2015-12-04 ENCOUNTER — Encounter: Payer: Self-pay | Admitting: Family Medicine

## 2015-12-04 VITALS — BP 110/64 | HR 78 | Temp 98.9°F | Resp 14 | Ht 69.0 in | Wt 232.0 lb

## 2015-12-04 DIAGNOSIS — K5901 Slow transit constipation: Secondary | ICD-10-CM

## 2015-12-04 DIAGNOSIS — K219 Gastro-esophageal reflux disease without esophagitis: Secondary | ICD-10-CM

## 2015-12-04 DIAGNOSIS — R079 Chest pain, unspecified: Secondary | ICD-10-CM | POA: Diagnosis not present

## 2015-12-04 MED ORDER — POLYETHYLENE GLYCOL 3350 17 GM/SCOOP PO POWD: 17.0000 g | Freq: Two times a day (BID) | ORAL | Status: DC | PRN

## 2015-12-04 MED ORDER — PANTOPRAZOLE SODIUM 40 MG PO TBEC: 40.0000 mg | DELAYED_RELEASE_TABLET | Freq: Every day | ORAL | Status: DC

## 2015-12-04 NOTE — Assessment & Plan Note (Signed)
Based on history and work up CP due to GERD. Also still taking NSAIDS D/C NSAIDS, can use Ultram, Increase protonix to  If for some reason Pain does not improve with treatment for GI cause, send to cardiology

## 2015-12-04 NOTE — Patient Instructions (Addendum)
Hold the ibuprofen and naproxen Increase protonix to  ( Take 2) - once a day  Ultram for pain  Miralax 1 capfull once a day  Call if not improved  Release of records- [facility]-   F/U as needed

## 2015-12-04 NOTE — Progress Notes (Signed)
Patient ID: Beverly Mclaughlin, female   DOB: [birth year], 45 y.o.   MRN: [id number]    Subjective:    Patient ID: Beverly Mclaughlin, female    DOB: 1971/06/19, 46 y.o.   MRN: [id number]  Patient presents for ER F/U  patient here for follow-up. She went to the emergency room on February 1 with chest discomfort. Initially she started taking prilosec then broke out in hives, so she stopped med but chest discomfort worsened. Felt the need to belch. She had been taking Naproxen s/p hysterectomy.  She had EKG which is unremarkable she had CT scan done which showed a pulmonary embolism or other acute pulmonary problem. She has no evidence of heart failure. Her troponins were negative. She returned to the emergency room on the next day because of recurrence of pain in the morning.Troponins again were negative EKG was unchanged. She was prescribed Protonix after she was given PPI in the emergency room and Ultram, Ibuprofen. She has been taking Ibuprofen and still gets some discomfort but much improved with protonix    Also admits to constipation since being on pain meds since surgery, strained the other day and hemorroids came out   Repeat EKG in office today Normal  Review Of Systems:  GEN- denies fatigue, fever, weight loss,weakness, recent illness HEENT- denies eye drainage, change in vision, nasal discharge, CVS-+ chest pain,  Denies palpitations RESP- denies SOB, cough, wheeze ABD- denies N/V, change in stools, abd pain GU- denies dysuria, hematuria, dribbling, incontinence MSK- denies joint pain, muscle aches, injury Neuro- denies headache, dizziness, syncope, seizure activity       Objective:    BP 110/64 mmHg  Pulse 78  Temp(Src) 98.9 F (37.2 C) (Oral)  Resp 14  Ht  (1.753 m)  Wt 232 lb (105.235 kg)  BMI 34.24 kg/m2  LMP 11/15/2015 GEN- NAD, alert and oriented x3 HEENT- PERRL, EOMI, non injected sclera, pink conjunctiva, MMM, oropharynx clear CVS- RRR, no  murmur RESP-CTAB ABD-NABS,soft,NT,ND EXT- No edema Pulses- Radial 2+ EKG-NSR        Assessment & Plan:      Problem List Items Addressed This Visit    Acid reflux    Based on history and work up CP due to GERD. Also still taking NSAIDS D/C NSAIDS, can use Ultram, Increase protonix to  If for some reason Pain does not improve with treatment for GI cause, send to cardiology       Relevant Medications   polyethylene glycol powder (GLYCOLAX/MIRALAX) powder   pantoprazole (PROTONIX) 40 MG tablet    Other Visit Diagnoses    Chest pain, unspecified    -  Primary    Relevant Orders    EKG 12-Lead (Completed)    Slow transit constipation        Trial of Miralax, while on pain meds        Note: This dictation was prepared with Dragon dictation along with smaller phrase technology. Any transcriptional errors that result from this process are unintentional.

## 2016-01-15 ENCOUNTER — Ambulatory Visit (INDEPENDENT_AMBULATORY_CARE_PROVIDER_SITE_OTHER): Payer: PRIVATE HEALTH INSURANCE | Admitting: Family Medicine

## 2016-01-15 ENCOUNTER — Encounter: Payer: Self-pay | Admitting: Family Medicine

## 2016-01-15 VITALS — BP 122/60 | HR 72 | Temp 98.7°F | Resp 18 | Ht 69.0 in | Wt 231.0 lb

## 2016-01-15 DIAGNOSIS — J020 Streptococcal pharyngitis: Secondary | ICD-10-CM

## 2016-01-15 LAB — STREP GROUP A AG, W/REFLEX TO CULT: STREGTOCOCCUS GROUP A AG SCREEN: NOT DETECTED

## 2016-01-15 MED ORDER — AMOXICILLIN 500 MG PO TABS: 500.0000 mg | ORAL_TABLET | Freq: Two times a day (BID) | ORAL | Status: DC

## 2016-01-15 MED ORDER — FIRST-DUKES MOUTHWASH MT SUSP: OROMUCOSAL | Status: DC

## 2016-01-15 NOTE — Progress Notes (Signed)
Patient ID: Beverly Mclaughlin, female   DOB: 02/26/1971, 45 y.o.   MRN: [id number]   Subjective:    Patient ID: Beverly Guntheramar Korol, female    DOB: 02/26/1971, 45 y.o.   MRN: [id number]  Patient presents for Illness Patient here with sore throat here pain mild sinus pressure for the past 3 days.Her throat is the worse. She also noted some pus on the back of her tonsils. She's not had any fever but has had some mild chills. No known sick contacts. She feels like her throat is very raw and on fire when she tries to eat or drink   Review Of Systems:  GEN- denies fatigue, fever, weight loss,weakness, recent illness HEENT- denies eye drainage, change in vision, +nasal discharge, CVS- denies chest pain, palpitations RESP- denies SOB, cough, wheeze ABD- denies N/V, change in stools, abd pain Neuro- denies headache, dizziness, syncope, seizure activity       Objective:    BP 122/60 mmHg  Pulse 72  Temp(Src) 98.7 F (37.1 C) (Oral)  Resp 18  Ht 5\' 9"  (1.753 m)  Wt 231 lb (104.781 kg)  BMI 34.10 kg/m2  LMP 11/15/2015 GEN- NAD, alert and oriented x3 HEENT- PERRL, EOMI, non injected sclera, pink conjunctiva, MMM, oropharynx injected, pus bilat tonsils, nares clear  Neck- Supple, + ant LAD  CVS- RRR, no murmur RESP-CTAB Pulses- Radial  2+        Assessment & Plan:      Problem List Items Addressed This Visit    None    Visit Diagnoses    Strep pharyngitis    -  Primary    Based on exaxm, history, bacterial strep, treat with amox, magic mouthwash    Relevant Medications    Diphenhyd-Hydrocort-Nystatin (FIRST-DUKES MOUTHWASH) SUSP    Other Relevant Orders    STREP GROUP A AG, W/REFLEX TO CULT (Completed)    Culture, Group A Strep       Note: This dictation was prepared with Dragon dictation along with smaller phrase technology. Any transcriptional errors that result from this process are unintentional.

## 2016-01-15 NOTE — Patient Instructions (Signed)
Take antibiotics Use magic mouthwash  Ibuprofen with food  F/U as previous

## 2016-01-17 LAB — CULTURE, GROUP A STREP: ORGANISM ID, BACTERIA: NORMAL

## 2016-03-03 ENCOUNTER — Emergency Department (HOSPITAL_COMMUNITY)
Admission: EM | Admit: 2016-03-03 | Discharge: 2016-03-03 | Disposition: A | Discharge status: Home / Self Care | Payer: PRIVATE HEALTH INSURANCE | Attending: Emergency Medicine | Admitting: Emergency Medicine

## 2016-03-03 ENCOUNTER — Encounter (HOSPITAL_COMMUNITY): Payer: Self-pay | Admitting: Emergency Medicine

## 2016-03-03 DIAGNOSIS — R002 Palpitations: Secondary | ICD-10-CM | POA: Insufficient documentation

## 2016-03-03 DIAGNOSIS — E860 Dehydration: Secondary | ICD-10-CM | POA: Insufficient documentation

## 2016-03-03 DIAGNOSIS — Z79899 Other long term (current) drug therapy: Secondary | ICD-10-CM | POA: Insufficient documentation

## 2016-03-03 LAB — URINALYSIS, ROUTINE W REFLEX MICROSCOPIC
BILIRUBIN URINE: NEGATIVE
GLUCOSE, UA: NEGATIVE mg/dL
HGB URINE DIPSTICK: NEGATIVE
Ketones, ur: NEGATIVE mg/dL
Leukocytes, UA: NEGATIVE
Nitrite: NEGATIVE
PH: 6 (ref 5.0–8.0)
SPECIFIC GRAVITY, URINE: 1.015 (ref 1.005–1.030)

## 2016-03-03 LAB — I-STAT CHEM 8, ED
BUN: 12 mg/dL (ref 6–20)
CALCIUM ION: 1.25 mmol/L — AB (ref 1.12–1.23)
CHLORIDE: 100 mmol/L — AB (ref 101–111)
CREATININE: 0.9 mg/dL (ref 0.44–1.00)
GLUCOSE: 80 mg/dL (ref 65–99)
HCT: 40 % (ref 36.0–46.0)
Hemoglobin: 13.6 g/dL (ref 12.0–15.0)
Potassium: 3.9 mmol/L (ref 3.5–5.1)
SODIUM: 140 mmol/L (ref 135–145)
TCO2: 25 mmol/L (ref 0–100)

## 2016-03-03 LAB — URINE MICROSCOPIC-ADD ON

## 2016-03-03 LAB — CBG MONITORING, ED: Glucose-Capillary: 97 mg/dL (ref 65–99)

## 2016-03-03 MED ORDER — SODIUM CHLORIDE 0.9 % IV BOLUS (SEPSIS)
1000.0000 mL | Freq: Once | INTRAVENOUS | Status: AC
  Administered 2016-03-03: 1000 mL via INTRAVENOUS

## 2016-03-03 NOTE — ED Provider Notes (Signed)
CSN: [id number]     Arrival date & time 03/03/16  1744 History   First MD Initiated Contact with Patient 03/03/16 1752     Chief Complaint  Patient presents with  . Near Syncope     (Consider location/radiation/quality/duration/timing/severity/associated sxs/prior Treatment) Patient is a 45 y.o. female presenting with palpitations.  Palpitations Palpitations quality:  Regular Onset quality:  Sudden Duration:  1 minute Timing:  Constant Chronicity:  Recurrent Context: not anxiety   Relieved by:  None tried Worsened by:  Nothing Ineffective treatments:  None tried Associated symptoms: weakness   Associated symptoms: no back pain, no chest pain, no dizziness, no nausea, no syncope and no vomiting     Past Medical History  Diagnosis Date  . Anemia    Past Surgical History  Procedure Laterality Date  . Tubal ligation    . Abdominal hysterectomy  Jan 2017    uterus only, -[facility]    Family History  Problem Relation Age of Onset  . Heart disease Mother   . Diabetes Mother   . Diabetes Father   . Kidney disease Father   . Hypertension Sister   . Cancer Maternal Grandmother   . Kidney disease Maternal Grandmother   . Diabetes Paternal Grandfather   . Multiple myeloma Mother    Social History  Substance Use Topics  . Smoking status: Never Smoker   . Smokeless tobacco: Never Used  . Alcohol Use: 1.2 oz/week    1 Glasses of wine, 1 Cans of beer per week     Comment: occasionally   OB History    No data available     Review of Systems  Cardiovascular: Positive for palpitations. Negative for chest pain and syncope.  Gastrointestinal: Negative for nausea and vomiting.  Musculoskeletal: Negative for back pain.  Neurological: Positive for weakness. Negative for dizziness.  All other systems reviewed and are negative.     Allergies  Prilosec  Home Medications   Prior to Admission medications   Medication Sig Start Date End Date Taking? Authorizing  Provider  Cyanocobalamin (VITAMIN B-12 PO) Take 1 tablet by mouth daily.   Yes Historical Provider, MD  pantoprazole (PROTONIX) 40 MG tablet Take 1 tablet (40 mg total) by mouth daily. 12/04/15  Yes Alycia Rossetti, MD  polyethylene glycol powder (GLYCOLAX/MIRALAX) powder Take 17 g by mouth 2 (two) times daily as needed. Patient taking differently: Take 17 g by mouth every other day.  12/04/15  Yes Alycia Rossetti, MD  amoxicillin (AMOXIL) 500 MG tablet Take 1 tablet (500 mg total) by mouth 2 (two) times daily. 01/15/16   Alycia Rossetti, MD  Diphenhyd-Hydrocort-Nystatin (FIRST-DUKES MOUTHWASH) SUSP Swish and swallow 1 tsp three times a day as needed 01/15/16   Alycia Rossetti, MD  traMADol (ULTRAM) 50 MG tablet Take 1 tablet (50 mg total) by mouth every 6 (six) hours as needed. 11/30/15   Milton Ferguson, MD   BP 124/85 mmHg  Pulse 73  Temp(Src) 97.7 F (36.5 C) (Oral)  Resp 19  Ht '5\' 8"'$  (1.727 m)  Wt 228 lb (103.42 kg)  BMI 34.68 kg/m2  SpO2 100%  LMP 11/15/2015 Physical Exam  Constitutional: She is oriented to person, place, and time. She appears well-developed and well-nourished.  HENT:  Head: Normocephalic and atraumatic.  Neck: Normal range of motion.  Cardiovascular: Normal rate and regular rhythm.   Pulmonary/Chest: Effort normal. No stridor. No respiratory distress.  Abdominal: Soft. She exhibits no distension. There is no tenderness.  Genitourinary: Guaiac negative stool. No vaginal discharge found.  Musculoskeletal: Normal range of motion. She exhibits no edema or tenderness.  Neurological: She is alert and oriented to person, place, and time.  Skin: Skin is warm and dry.  Nursing note and vitals reviewed.   ED Course  Procedures (including critical care time) Labs Review Labs Reviewed  URINALYSIS, ROUTINE W REFLEX MICROSCOPIC (NOT AT [facility]) - Abnormal; Notable for the following:    APPearance HAZY (*)    Protein, ur TRACE (*)    All other components within normal limits   URINE MICROSCOPIC-ADD ON - Abnormal; Notable for the following:    Squamous Epithelial / LPF 6-30 (*)    Bacteria, UA FEW (*)    All other components within normal limits  I-STAT CHEM 8, ED - Abnormal; Notable for the following:    Chloride 100 (*)    Calcium, Ion 1.25 (*)    All other components within normal limits  CBG MONITORING, ED    Imaging Review No results found. I have personally reviewed and evaluated these images and lab results as part of my medical decision-making.   EKG Interpretation   Date/Time:  Sunday Mar 03 2016 17:59:15 EDT Ventricular Rate:  84 PR Interval:  151 QRS Duration: 107 QT Interval:  358 QTC Calculation: 423 R Axis:   45 Text Interpretation:  Sinus rhythm Confirmed by Jeneen Rinks  MD, [place] ([id number]) on  03/03/2016 6:01:16 PM      MDM   Final diagnoses:  Palpitations  Dehydration   Palpitations and near syncope, likely 2/2 dehydration. Will fluid hydrate, ecg, labs and reevaluate.   Reevaluation after a liter of fluids and symptoms totally resolved. Neuro exam still normal.  Will dc with pcp follow up as needed if return of symptoms.   New Prescriptions: Discharge Medication List as of 03/03/2016  7:57 PM     I have personally and contemperaneously reviewed labs and imaging and used in my decision making as above.   A medical screening exam was performed and I feel the patient has had an appropriate workup for their chief complaint at this time and likelihood of emergent condition existing is low and thus workup can continue on an outpatient basis.. Their vital signs are stable. They have been counseled on decision, discharge, follow up and which symptoms necessitate immediate return to the emergency department.  They verbally stated understanding and agreement with plan and discharged in stable condition.      Merrily Pew, MD 03/03/16 2033

## 2016-03-03 NOTE — ED Notes (Signed)
Pt reports a near syncopal episode at church. Pt states she felt her heart racing for a minute. States that she was given water and felt better. States this was her first water today. Also states that after episode she felt fatigued. AOx4. VSS.

## 2016-03-18 ENCOUNTER — Ambulatory Visit: Payer: Self-pay | Admitting: Family Medicine

## 2016-03-20 ENCOUNTER — Telehealth: Payer: Self-pay | Admitting: Family Medicine

## 2016-03-20 NOTE — Telephone Encounter (Signed)
Call placed to patient. LMTRC.  

## 2016-03-20 NOTE — Telephone Encounter (Signed)
Patient called back   CB# [phone]

## 2016-03-20 NOTE — Telephone Encounter (Signed)
[facility] [place]  Patient is asking if by chance dr Jeanice Limdurham can call in something for vaginal itching for her  [phone]

## 2016-03-21 MED ORDER — FLUCONAZOLE 150 MG PO TABS: 150.0000 mg | ORAL_TABLET | Freq: Once | ORAL | Status: DC

## 2016-03-21 NOTE — Telephone Encounter (Signed)
Call placed to patient.   Reports that she has itching around the vulva and inner labia.   Denies burning with urination or intercourse. Denies vaginal discharge.   Prescription sent to pharmacy for Diflucan. Advised that if S/Sx do not resolve after dosage, OV will be required.

## 2016-04-10 ENCOUNTER — Other Ambulatory Visit: Payer: Self-pay | Admitting: Family Medicine

## 2016-07-09 ENCOUNTER — Encounter: Payer: Self-pay | Admitting: Family Medicine

## 2016-07-09 ENCOUNTER — Ambulatory Visit (INDEPENDENT_AMBULATORY_CARE_PROVIDER_SITE_OTHER): Payer: PRIVATE HEALTH INSURANCE | Admitting: Family Medicine

## 2016-07-09 VITALS — BP 118/60 | HR 72 | Temp 98.9°F | Resp 16 | Wt 230.0 lb

## 2016-07-09 DIAGNOSIS — J0101 Acute recurrent maxillary sinusitis: Secondary | ICD-10-CM | POA: Diagnosis not present

## 2016-07-09 MED ORDER — FLUCONAZOLE 150 MG PO TABS: 150.0000 mg | ORAL_TABLET | Freq: Once | ORAL | 1 refills | Status: AC

## 2016-07-09 MED ORDER — AMOXICILLIN 875 MG PO TABS: 875.0000 mg | ORAL_TABLET | Freq: Two times a day (BID) | ORAL | 0 refills | Status: DC

## 2016-07-09 NOTE — Patient Instructions (Addendum)
Sudafed 3 days to decongestant  Okay to take zyrtec  Take antibiotic as prescribed Use throat drops/lozenges  Take diflucan if you get yeast infection  F/U as needed

## 2016-07-09 NOTE — Progress Notes (Signed)
   Subjective:    Patient ID: Beverly Mclaughlin, female    DOB: [birth year], 45 y.o.   MRN: [id number]  Patient presents for Sore Throat (pain in both ears)  Pt here with sinus pressure drainage ear pain that is worsening over the past 7 days. She's not had any fever but has had chills. She has been taking over-the-counter allergy medication ,dayquil No significant cough. No known sick contacts but she has been traveling a lot for work   Review Of Systems:  GEN- denies fatigue, fever, weight loss,weakness, recent illness HEENT- denies eye drainage, change in vision, +nasal discharge, CVS- denies chest pain, palpitations RESP- denies SOB, +cough, wheeze ABD- denies N/V, change in stools, abd pain Neuro- denies headache, dizziness, syncope, seizure activity       Objective:    BP 118/60 (BP Location: Right Arm, Patient Position: Sitting, Cuff Size: Normal)   Pulse 72   Temp 98.9 F (37.2 C) (Oral)   Resp 16   Wt 230 lb (104.3 kg)   LMP 11/15/2015   BMI 34.97 kg/m  GEN- NAD, alert and oriented x3 HEENT- PERRL, EOMI, non injected sclera, pink conjunctiva, MMM, oropharynx mild injection, TM clear bilat no effusion,  + maxillary sinus tenderness, inflammed turbinates,  Nasal drainage  Neck- Supple, shotty ant  LAD CVS- RRR, no murmur RESP-CTAB EXT- No edema Pulses- Radial 2+        Assessment & Plan:      Problem List Items Addressed This Visit    None    Visit Diagnoses    Acute recurrent maxillary sinusitis    -  Primary   Take Sudafed for decongestant , amox, zyrtec , salt water gargle    Relevant Medications   amoxicillin (AMOXIL) 875 MG tablet   fluconazole (DIFLUCAN) 150 MG tablet      Note: This dictation was prepared with Dragon dictation along with smaller phrase technology. Any transcriptional errors that result from this process are unintentional.

## 2016-11-27 DIAGNOSIS — Z90710 Acquired absence of both cervix and uterus: Secondary | ICD-10-CM | POA: Insufficient documentation

## 2017-03-07 ENCOUNTER — Ambulatory Visit: Payer: Self-pay | Admitting: Family Medicine

## 2017-03-12 ENCOUNTER — Ambulatory Visit (INDEPENDENT_AMBULATORY_CARE_PROVIDER_SITE_OTHER): Payer: PRIVATE HEALTH INSURANCE | Admitting: Family Medicine

## 2017-03-12 ENCOUNTER — Encounter: Payer: Self-pay | Admitting: Family Medicine

## 2017-03-12 VITALS — BP 112/68 | HR 80 | Temp 98.4°F | Resp 16 | Ht 69.0 in | Wt 233.0 lb

## 2017-03-12 DIAGNOSIS — J0100 Acute maxillary sinusitis, unspecified: Secondary | ICD-10-CM | POA: Diagnosis not present

## 2017-03-12 DIAGNOSIS — R519 Headache, unspecified: Secondary | ICD-10-CM

## 2017-03-12 DIAGNOSIS — K2970 Gastritis, unspecified, without bleeding: Secondary | ICD-10-CM

## 2017-03-12 DIAGNOSIS — R51 Headache: Secondary | ICD-10-CM

## 2017-03-12 MED ORDER — PANTOPRAZOLE SODIUM 40 MG PO TBEC: 40.0000 mg | DELAYED_RELEASE_TABLET | Freq: Every day | ORAL | 5 refills | Status: DC

## 2017-03-12 NOTE — Progress Notes (Signed)
   Subjective:    Patient ID: Beverly Mclaughlin, female    DOB: 14-Jan-1971, 46 y.o.   MRN: [id number]  Patient presents for Facial Pain (has had teeth removed- concern for sinus pressure vs TMJ) and Epigastric Pain (x weeks- upper abdominal pain with nausea- states that she is not sure if it is coming from prior opiod use or food poisioning.)   In march had deep cavitiy had filling done, afterwards she starting having right sided facial pain. Pain was more needle like sensation that radiate up right cheek toward eye. Went back a few weeks later, had tooth grinded down, pain persisted even after that procedure. She had consult for wisdom tooth(April 18th)removal by oral surgeon and they removed thinking cause of pain and pain persisted. She went back for another molar  To be pulled  that she felt was initially causing the pain on May 2nd. She had temporary improvement then began having a similar nerve pain shooting upwards. She did take amoxicillin in April  She has has some mild sinus drainage    has had some nauea for the past week no vomiting, no diarrhea, took some tums which helped Has also been taking NSAIDS since March and goody powders last week when symptoms started. No change in bowels  Out of her PPI for past month   Review Of Systems: per above   GEN- denies fatigue, fever, weight loss,weakness, recent illness HEENT- denies eye drainage, change in vision, nasal discharge, CVS- denies chest pain, palpitations RESP- denies SOB, cough, wheeze ABD- + N/ No V, change in stools, abd pain GU- denies dysuria, hematuria, dribbling, incontinence MSK- denies joint pain, muscle aches, injury Neuro- denies headache, dizziness, syncope, seizure activity       Objective:    BP 112/68   Pulse 80   Temp 98.4 F (36.9 C) (Oral)   Resp 16   Ht 5\' 9"  (1.753 m)   Wt 233 lb (105.7 kg)   LMP 11/15/2015   SpO2 96%   BMI 34.41 kg/m  GEN- NAD, alert and oriented x3 HEENT- PERRL, EOMI, non  injected sclera, pink conjunctiva, MMM, oropharynx clear, mild TTP right lower maxillary sinus, no frontal sinus tenderness, no abscess gum lime, no TMJ tenderness  Neck- Supple, no  LAD  CVS- RRR, no murmur RESP-CTAB ABD-NABS,soft,NT,ND Pulses- Radial  2+        Assessment & Plan:      Problem List Items Addressed This Visit    None    Visit Diagnoses    Gastritis without bleeding, unspecified chronicity, unspecified gastritis type    -  Primary   STOP NSAIDS has known reflux, restart protonix   Facial pain       DD include maxillary sinusitis, trigemnial neuralgia . Obtain CT max face with her recent surgeries with no improvement in symptoms. If neg send to ENT   Relevant Orders   CT Maxillofacial W/Cm   Acute maxillary sinusitis, recurrence not specified       Relevant Orders   CT Maxillofacial W/Cm      Note: This dictation was prepared with Dragon dictation along with smaller phrase technology. Any transcriptional errors that result from this process are unintentional.

## 2017-03-12 NOTE — Patient Instructions (Addendum)
F/U pending results  Restart the protonix CT scan to be done

## 2017-03-14 ENCOUNTER — Ambulatory Visit
Admission: RE | Admit: 2017-03-14 | Discharge: 2017-03-14 | Disposition: A | Discharge status: Home / Self Care | Payer: PRIVATE HEALTH INSURANCE | Source: Ambulatory Visit | Attending: Family Medicine | Admitting: Family Medicine

## 2017-03-14 ENCOUNTER — Other Ambulatory Visit: Payer: Self-pay | Admitting: *Deleted

## 2017-03-14 DIAGNOSIS — R519 Headache, unspecified: Secondary | ICD-10-CM

## 2017-03-14 DIAGNOSIS — R51 Headache: Principal | ICD-10-CM

## 2017-03-14 DIAGNOSIS — J0100 Acute maxillary sinusitis, unspecified: Secondary | ICD-10-CM

## 2017-03-14 MED ORDER — IOPAMIDOL (ISOVUE-300) INJECTION 61%
75.0000 mL | Freq: Once | INTRAVENOUS | Status: AC | PRN
  Administered 2017-03-14: 75 mL via INTRAVENOUS

## 2017-04-14 ENCOUNTER — Telehealth: Payer: Self-pay | Admitting: *Deleted

## 2017-04-14 NOTE — Telephone Encounter (Signed)
Received call from patient.   States that she has appointment to be seen at ENT on Wed, but she is having increased facial pain. Advised to alternate motrin and APAP.   States that she will F/U after ENT appointment.

## 2017-04-15 ENCOUNTER — Telehealth: Payer: Self-pay | Admitting: *Deleted

## 2017-04-15 DIAGNOSIS — R51 Headache: Principal | ICD-10-CM

## 2017-04-15 DIAGNOSIS — R519 Headache, unspecified: Secondary | ICD-10-CM

## 2017-04-15 NOTE — Telephone Encounter (Signed)
Received call from patient.   States that she has been doing research into her facial pain since discussing with dentist, and believes that her pain is most likely attributed to trigeminal neuralgia. States that she would like to cancel the ENT appointment on Wed, and be referred to neurology instead.   MD please advise.

## 2017-04-15 NOTE — Telephone Encounter (Signed)
Okay to cancel ENT appt and send to neurology- Dx facial pain

## 2017-04-16 NOTE — Telephone Encounter (Signed)
Referral orders placed.   Call placed to patient and patient made aware.  

## 2017-04-21 ENCOUNTER — Telehealth: Payer: Self-pay | Admitting: *Deleted

## 2017-04-21 NOTE — Telephone Encounter (Signed)
Received VM from patient.   Inquired as to status of Neurology referral.   Please contact patient to discuss.

## 2017-04-22 ENCOUNTER — Telehealth: Payer: Self-pay | Admitting: *Deleted

## 2017-04-22 MED ORDER — HYDROCODONE-ACETAMINOPHEN 5-325 MG PO TABS: 1.0000 | ORAL_TABLET | Freq: Four times a day (QID) | ORAL | 0 refills | Status: DC | PRN

## 2017-04-22 MED ORDER — GABAPENTIN 100 MG PO CAPS: ORAL_CAPSULE | ORAL | 1 refills | Status: DC

## 2017-04-22 NOTE — Telephone Encounter (Signed)
Call placed to patient and patient made aware.   Prescription printed and patient made aware to come to office to pick up.   Prescription sent to pharmacy.

## 2017-04-22 NOTE — Telephone Encounter (Signed)
Received call from patient.   Reports that she has initial consult with Neuro on 05/30/2017.  Reports that she continues to have pain. Requested pain medication.

## 2017-04-22 NOTE — Telephone Encounter (Signed)
Okay to start Gabpentin 100mg  at bedtime, increase to 200mg  in 2 weeks. This is for nerve pain Can also give Norco 5-325mg  1 po Q 6hr prn pain #20

## 2017-04-29 ENCOUNTER — Encounter (INDEPENDENT_AMBULATORY_CARE_PROVIDER_SITE_OTHER): Payer: Self-pay

## 2017-04-29 ENCOUNTER — Encounter: Payer: Self-pay | Admitting: Neurology

## 2017-04-29 ENCOUNTER — Ambulatory Visit (INDEPENDENT_AMBULATORY_CARE_PROVIDER_SITE_OTHER): Payer: PRIVATE HEALTH INSURANCE | Admitting: Neurology

## 2017-04-29 VITALS — BP 122/79 | HR 80 | Ht 69.0 in | Wt 234.0 lb

## 2017-04-29 DIAGNOSIS — Z5181 Encounter for therapeutic drug level monitoring: Secondary | ICD-10-CM | POA: Diagnosis not present

## 2017-04-29 DIAGNOSIS — G500 Trigeminal neuralgia: Secondary | ICD-10-CM | POA: Diagnosis not present

## 2017-04-29 HISTORY — DX: Trigeminal neuralgia: G50.0

## 2017-04-29 MED ORDER — GABAPENTIN 300 MG PO CAPS: 300.0000 mg | ORAL_CAPSULE | Freq: Two times a day (BID) | ORAL | 3 refills | Status: DC

## 2017-04-29 NOTE — Patient Instructions (Signed)
   We will go up on the gabapentin dose to 300 mg twice a day. Call for any dose adjustments. We will check MRI of the brain.  Neurontin (gabapentin) may result in drowsiness, ankle swelling, gait instability, or possibly dizziness. Please contact our office if significant side effects occur with this medication.

## 2017-04-29 NOTE — Progress Notes (Signed)
  Reason for visit: Right facial pain  Referring physician: Dr. Colbert  Beverly Mclaughlin is a 46 y.o. female  History of present illness:  Ms. Bartolini is a 46-year-old right-handed black female with a history of onset of right mid face discomfort that began in March 2018. The patient had just had a filling done on a tooth in the right maxillary area a few days prior to onset of symptoms. The patient has noted some sharp jabbing pains ago from the right upper lip to the orbital area on the right. The patient indicates that chewing or talking may increase the frequency of the pain. The patient was seen back by her dentist and the tooth was filed down slightly without benefit, the patient then had an evaluation through an oral surgeon and her wisdom teeth were removed along with another molar but the pain continued. The patient was seen by an endodontist, no dental abnormalities were noted. The patient was set up for a CT of the maxillofacial area which was unremarkable. The patient denies any numbness of the face, she has not had any weakness or numbness of extremities. At times she may have some slight imbalance, she denies issues controlling the bowels or the bladder. She has been placed on low-dose gabapentin taking 200 mg daily, this may have started to benefit the pain slightly. She is sent to this office for further evaluation.  Past Medical History:  Diagnosis Date  . Anemia   . Facial pain     Past Surgical History:  Procedure Laterality Date  . ABDOMINAL HYSTERECTOMY  Jan 2017   uterus only, -[facility]   . TUBAL LIGATION      Family History  Problem Relation Age of Onset  . Heart disease Mother   . Diabetes Mother   . Multiple myeloma Mother   . Diabetes Father   . Kidney disease Father   . Hypertension Sister   . Cancer Maternal Grandmother   . Kidney disease Maternal Grandmother   . Diabetes Paternal Grandfather     Social history:  reports that she has never  smoked. She has never used smokeless tobacco. She reports that she drinks about 1.2 oz of alcohol per week . She reports that she does not use drugs.  Medications:  Prior to Admission medications   Medication Sig Start Date End Date Taking? Authorizing Provider  gabapentin (NEURONTIN) 100 MG capsule 100mg at bedtime, increase to 200mg in 2 weeks 04/22/17  Yes [place], Kawanta F, MD  HYDROcodone-acetaminophen (NORCO) 5-325 MG tablet Take 1 tablet by mouth every 6 (six) hours as needed for moderate pain. 04/22/17  Yes [place], Kawanta F, MD  pantoprazole (PROTONIX) 40 MG tablet Take 1 tablet (40 mg total) by mouth daily. 03/12/17  Yes Baring, Kawanta F, MD      Allergies  Allergen Reactions  . Prilosec [Omeprazole] Hives and Rash    ROS:  Out of a complete 14 system review of symptoms, the patient complains only of the following symptoms, and all other reviewed systems are negative.  Right facial pain  Blood pressure 122/79, pulse 80, height 5' 9" (1.753 m), weight 234 lb (106.1 kg), last menstrual period 11/15/2015.  Physical Exam  General: The patient is alert and cooperative at the time of the examination.  Eyes: Pupils are equal, round, and reactive to light. Discs are flat bilaterally.  Neck: The neck is supple, no carotid bruits are noted.  Respiratory: The respiratory examination is clear.  Cardiovascular: The   cardiovascular examination reveals a regular rate and rhythm, a grade I/VI systolic ejection murmur is noted in the aortic area.  Skin: Extremities are without significant edema.  Neurologic Exam  Mental status: The patient is alert and oriented x 3 at the time of the examination. The patient has apparent normal recent and remote memory, with an apparently normal attention span and concentration ability.  Cranial nerves: Facial symmetry is present. There is good sensation of the face to pinprick and soft touch bilaterally. The strength of the facial muscles and the  muscles to head turning and shoulder shrug are normal bilaterally. Speech is well enunciated, no aphasia or dysarthria is noted. Extraocular movements are full. Visual fields are full. The tongue is midline, and the patient has symmetric elevation of the soft palate. No obvious hearing deficits are noted.  Motor: The motor testing reveals 5 over 5 strength of all 4 extremities. Good symmetric motor tone is noted throughout.  Sensory: Sensory testing is intact to pinprick, soft touch, vibration sensation, and position sense on all 4 extremities. No evidence of extinction is noted.  Coordination: Cerebellar testing reveals good finger-nose-finger and heel-to-shin bilaterally.  Gait and station: Gait is normal. Tandem gait is normal. Romberg is negative. No drift is seen.  Reflexes: Deep tendon reflexes are symmetric and normal bilaterally. Toes are downgoing bilaterally.   Assessment/Plan:  1. Trigeminal neuralgia, right V2 distribution  The patient will be set up for MRI evaluation of the brain to exclude demyelinating disease. The patient will have blood work to document renal function today. She will be increased on the gabapentin taking 300 mg twice daily, she will call for dose adjustments. The patient will follow-up in 2 months.  C. Keith Willis MD 04/29/2017 10:04 AM  [facility] [address] [place], [place] [address]  Phone [phone] Fax [phone]  

## 2017-04-30 LAB — COMPREHENSIVE METABOLIC PANEL
A/G RATIO: 1.4 (ref 1.2–2.2)
ALBUMIN: 4.1 g/dL (ref 3.5–5.5)
ALT: 10 IU/L (ref 0–32)
AST: 18 IU/L (ref 0–40)
Alkaline Phosphatase: 50 IU/L (ref 39–117)
BUN / CREAT RATIO: 13 (ref 9–23)
BUN: 11 mg/dL (ref 6–24)
CALCIUM: 9.7 mg/dL (ref 8.7–10.2)
CHLORIDE: 100 mmol/L (ref 96–106)
CO2: 25 mmol/L (ref 20–29)
Creatinine, Ser: 0.82 mg/dL (ref 0.57–1.00)
GFR calc Af Amer: 99 mL/min/{1.73_m2} (ref >59)
GFR calc non Af Amer: 85 mL/min/{1.73_m2} (ref >59)
Globulin, Total: 3 g/dL (ref 1.5–4.5)
Glucose: 94 mg/dL (ref 65–99)
POTASSIUM: 5.5 mmol/L — AB (ref 3.5–5.2)
Sodium: 139 mmol/L (ref 134–144)
TOTAL PROTEIN: 7.1 g/dL (ref 6.0–8.5)

## 2017-05-13 ENCOUNTER — Ambulatory Visit
Admission: RE | Admit: 2017-05-13 | Discharge: 2017-05-13 | Disposition: A | Discharge status: Home / Self Care | Payer: PRIVATE HEALTH INSURANCE | Source: Ambulatory Visit | Attending: Neurology | Admitting: Neurology

## 2017-05-13 DIAGNOSIS — G500 Trigeminal neuralgia: Secondary | ICD-10-CM | POA: Diagnosis not present

## 2017-05-13 MED ORDER — GADOBENATE DIMEGLUMINE 529 MG/ML IV SOLN
20.0000 mL | Freq: Once | INTRAVENOUS | Status: AC | PRN
  Administered 2017-05-13: 20 mL via INTRAVENOUS

## 2017-05-15 ENCOUNTER — Telehealth: Payer: Self-pay | Admitting: Neurology

## 2017-05-15 DIAGNOSIS — G500 Trigeminal neuralgia: Secondary | ICD-10-CM

## 2017-05-15 DIAGNOSIS — R9082 White matter disease, unspecified: Secondary | ICD-10-CM

## 2017-05-15 NOTE — Telephone Encounter (Signed)
I called the patient. The MRI shows a moderate level of white matter changes, some areas are moderate in size, the patient does not have hypertension or diabetes, no risk factors for small vessel disease.  Given the fact that she also has trigeminal neuralgia, I will consider a lumbar puncture to exclude demyelinating disease. The patient is amenable to this.   MRI brain 05/15/17:  IMPRESSION:  Abnormal MRI brain (with and without) demonstrating: 1. Mild scattered, periventricular and subcortical foci of non-specific T2 hyperintensities. Considerations include autoimmune, inflammatory, post-infectious, or microvascular ischemic etiologies.  2. No abnormal lesions are seen on post contrast views.  3. No acute findings.

## 2017-05-26 ENCOUNTER — Telehealth: Payer: Self-pay | Admitting: Neurology

## 2017-05-26 MED ORDER — GABAPENTIN 300 MG PO CAPS: ORAL_CAPSULE | ORAL | 3 refills | Status: DC

## 2017-05-26 NOTE — Telephone Encounter (Signed)
Pt wants a call back to know if she can do an increase in gabapentin (NEURONTIN) 300 MG capsule, please call

## 2017-05-26 NOTE — Telephone Encounter (Signed)
I called patient. The gabapentin dose can be increased, we will go to 300 mg 3 times daily for one week, then go to 1 capsule twice during the day and 2 at night.

## 2017-05-26 NOTE — Addendum Note (Signed)
Addended by: York SpanielWILLIS, Zadia Uhde K on: 05/26/2017 12:46 PM   Modules accepted: Orders

## 2017-05-30 ENCOUNTER — Ambulatory Visit: Payer: Self-pay | Admitting: Neurology

## 2017-06-02 ENCOUNTER — Telehealth: Payer: Self-pay | Admitting: Neurology

## 2017-06-02 ENCOUNTER — Ambulatory Visit
Admission: RE | Admit: 2017-06-02 | Discharge: 2017-06-02 | Disposition: A | Discharge status: Home / Self Care | Payer: PRIVATE HEALTH INSURANCE | Source: Ambulatory Visit | Attending: Neurology | Admitting: Neurology

## 2017-06-02 VITALS — BP 145/73 | HR 61

## 2017-06-02 DIAGNOSIS — R9082 White matter disease, unspecified: Secondary | ICD-10-CM

## 2017-06-02 DIAGNOSIS — G500 Trigeminal neuralgia: Secondary | ICD-10-CM

## 2017-06-02 LAB — CSF CELL COUNT WITH DIFFERENTIAL
RBC COUNT CSF: 0 {cells}/uL (ref 0–10)
WBC, CSF: 1 cells/uL (ref 0–5)

## 2017-06-02 LAB — PROTEIN, CSF: TOTAL PROTEIN, CSF: 27 mg/dL (ref 15–45)

## 2017-06-02 LAB — GLUCOSE, CSF: Glucose, CSF: 57 mg/dL (ref 43–76)

## 2017-06-02 NOTE — Discharge Instructions (Signed)

## 2017-06-02 NOTE — Telephone Encounter (Signed)
Opening pressure for the spinal tap was elevated at 32 cm of water.  The patient could have subclinical pseudotumor cerebri.  May consider treatment with Diamox or Topamax in the future.

## 2017-06-02 NOTE — Progress Notes (Signed)
Blood drawn from right AC to go with spina fluid. 1 SST tube drawn, site is unremarkable and pt tolerated well. Discharge instructions explained to pt.

## 2017-06-04 LAB — VDRL, CSF: SYPHILIS VDRL QUANT CSF: NONREACTIVE

## 2017-06-04 LAB — ANGIOTENSIN CONVERTING ENZYME, CSF: ACE, CSF: 9 U/L (ref <=15)

## 2017-06-09 ENCOUNTER — Telehealth: Payer: Self-pay | Admitting: Neurology

## 2017-06-09 ENCOUNTER — Other Ambulatory Visit: Payer: Self-pay | Admitting: Neurology

## 2017-06-09 ENCOUNTER — Encounter: Payer: Self-pay | Admitting: Neurology

## 2017-06-09 LAB — OLIGOCLONAL BANDS, CSF + SERM

## 2017-06-09 MED ORDER — GABAPENTIN 600 MG PO TABS: 600.0000 mg | ORAL_TABLET | Freq: Three times a day (TID) | ORAL | 3 refills | Status: DC

## 2017-06-09 NOTE — Telephone Encounter (Signed)
I called patient. Spinal fluid analysis does not show evidence of oligoclonal banding within the central nervous system. This would lean against the diagnosis of multiple sclerosis.  We will follow the MRI the brain, rechecking a study in about one year.

## 2017-07-10 ENCOUNTER — Ambulatory Visit: Payer: Self-pay | Admitting: Nurse Practitioner

## 2017-08-05 ENCOUNTER — Other Ambulatory Visit: Payer: Self-pay | Admitting: Family Medicine

## 2017-08-05 DIAGNOSIS — Z1231 Encounter for screening mammogram for malignant neoplasm of breast: Secondary | ICD-10-CM

## 2017-08-09 ENCOUNTER — Other Ambulatory Visit: Payer: Self-pay | Admitting: Family Medicine

## 2017-08-10 NOTE — Progress Notes (Deleted)
[facility]  PATIENT: Beverly Mclaughlin DOB: 09-04-1971   REASON FOR VISIT: *** HISTORY FROM:    HISTORY OF PRESENT ILLNESS:Beverly Mclaughlin is a 47 year old right-handed black female with a history of onset of right mid face discomfort that began in March 2018. The patient had just had a filling done on a tooth in the right maxillary area a few days prior to onset of symptoms. The patient has noted some sharp jabbing pains ago from the right upper lip to the orbital area on the right. The patient indicates that chewing or talking may increase the frequency of the pain. The patient was seen back by her dentist and the tooth was filed down slightly without benefit, the patient then had an evaluation through an oral surgeon and her wisdom teeth were removed along with another molar but the pain continued. The patient was seen by an endodontist, no dental abnormalities were noted. The patient was set up for a CT of the maxillofacial area which was unremarkable. The patient denies any numbness of the face, she has not had any weakness or numbness of extremities. At times she may have some slight imbalance, she denies issues controlling the bowels or the bladder. She has been placed on low-dose gabapentin taking 200 mg daily, this may have started to benefit the pain slightly. She is sent to this office for further evaluation   REVIEW OF SYSTEMS: Full 14 system review of systems performed and notable only for those listed, all others are neg:  Constitutional: neg  Cardiovascular: neg Ear/Nose/Throat: neg  Skin: neg Eyes: neg Respiratory: neg Gastroitestinal: neg  Hematology/Lymphatic: neg  Endocrine: neg Musculoskeletal:neg Allergy/Immunology: neg Neurological: neg Psychiatric: neg Sleep : neg   ALLERGIES: Allergies  Allergen Reactions  . Prilosec [Omeprazole] Hives and Rash    HOME MEDICATIONS: Outpatient Medications Prior to Visit  Medication Sig Dispense Refill  .  gabapentin (NEURONTIN) 600 MG tablet Take 1 tablet (600 mg total) by mouth 3 (three) times daily. 90 tablet 3  . HYDROcodone-acetaminophen (NORCO) 5-325 MG tablet Take 1 tablet by mouth every 6 (six) hours as needed for moderate pain. 20 tablet 0  . pantoprazole (PROTONIX) 40 MG tablet Take 1 tablet (40 mg total) by mouth daily. 30 tablet 5   No facility-administered medications prior to visit.     PAST MEDICAL HISTORY: Past Medical History:  Diagnosis Date  . Anemia   . Facial pain   . Trigeminal neuralgia 04/29/2017    Right V2    PAST SURGICAL HISTORY: Past Surgical History:  Procedure Laterality Date  . ABDOMINAL HYSTERECTOMY  Jan 2017   uterus only, -[facility]   . TUBAL LIGATION      FAMILY HISTORY: Family History  Problem Relation Age of Onset  . Heart disease Mother   . Diabetes Mother   . Multiple myeloma Mother   . Diabetes Father   . Kidney disease Father   . Hypertension Sister   . Cancer Maternal Grandmother   . Kidney disease Maternal Grandmother   . Diabetes Paternal Grandfather     SOCIAL HISTORY: Social History   Social History  . Marital status: Divorced    Spouse name: N/A  . Number of children: 2  . Years of education: some college   Occupational History  . Crediantial Coordinator    Social History Main Topics  . Smoking status: Never Smoker  . Smokeless tobacco: Never Used  . Alcohol use 1.2 oz/week    1 Glasses of  wine, 1 Cans of beer per week     Comment: occasionally  . Drug use: No  . Sexual activity: Yes    Birth control/ protection: Surgical   Other Topics Concern  . Not on file   Social History Narrative   Lives at home with two sons.   Right-handed.   2 cups coffee daily.     PHYSICAL EXAM  There were no vitals filed for this visit. There is no height or weight on file to calculate BMI.  Generalized: Well developed, in no acute distress  Head: normocephalic and atraumatic,. Oropharynx benign  Neck: Supple, no  carotid bruits  Cardiac: Regular rate rhythm, no murmur  Musculoskeletal: No deformity   Neurological examination   Mentation: Alert oriented to time, place, history taking. Attention span and concentration appropriate. Recent and remote memory intact.  Follows all commands speech and language fluent.   Cranial nerve II-XII: Fundoscopic exam reveals sharp disc margins.Pupils were equal round reactive to light extraocular movements were full, visual field were full on confrontational test. Facial sensation and strength were normal. hearing was intact to finger rubbing bilaterally. Uvula tongue midline. head turning and shoulder shrug were normal and symmetric.Tongue protrusion into cheek strength was normal. Motor: normal bulk and tone, full strength in the BUE, BLE, fine finger movements normal, no pronator drift. No focal weakness Sensory: normal and symmetric to light touch, pinprick, and  Vibration, proprioception  Coordination: finger-nose-finger, heel-to-shin bilaterally, no dysmetria Reflexes: Brachioradialis 2/2, biceps 2/2, triceps 2/2, patellar 2/2, Achilles 2/2, plantar responses were flexor bilaterally. Gait and Station: Rising up from seated position without assistance, normal stance,  moderate stride, good arm swing, smooth turning, able to perform tiptoe, and heel walking without difficulty. Tandem gait is steady  DIAGNOSTIC DATA (LABS, IMAGING, TESTING) - I reviewed patient records, labs, notes, testing and imaging myself where available.  Lab Results  Component Value Date   WBC 7.2 11/30/2015   HGB 13.6 03/03/2016   HCT 40.0 03/03/2016   MCV 98.1 11/30/2015   PLT 312 11/30/2015      Component Value Date/Time   NA 139 04/29/2017 1035   K 5.5 (H) 04/29/2017 1035   CL 100 04/29/2017 1035   CO2 25 04/29/2017 1035   GLUCOSE 94 04/29/2017 1035   GLUCOSE 80 03/03/2016 1821   BUN 11 04/29/2017 1035   CREATININE 0.82 04/29/2017 1035   CREATININE 0.75 05/31/2015 0830    CALCIUM 9.7 04/29/2017 1035   PROT 7.1 04/29/2017 1035   ALBUMIN 4.1 04/29/2017 1035   AST 18 04/29/2017 1035   ALT 10 04/29/2017 1035   ALKPHOS 50 04/29/2017 1035   BILITOT <0.2 04/29/2017 1035   GFRNONAA 85 04/29/2017 1035   GFRNONAA >89 05/31/2015 0830   GFRAA 99 04/29/2017 1035   GFRAA >89 05/31/2015 0830   Lab Results  Component Value Date   CHOL 129 05/31/2015   HDL 64 05/31/2015   LDLCALC 54 05/31/2015   TRIG 57 05/31/2015   CHOLHDL 2.0 05/31/2015   Lab Results  Component Value Date   HGBA1C 5.3 05/24/2014   No results found for: [id number] Lab Results  Component Value Date   TSH 0.974 05/31/2015    ***  ASSESSMENT AND PLAN  46 y.o. year old female  has a past medical history of Anemia; Facial pain; and Trigeminal neuralgia (04/29/2017). here with ***  Trigeminal neuralgia, right V2 distribution  The patient will be set up for MRI evaluation of the brain to exclude demyelinating disease.  The patient will have blood work to document renal function today. She will be increased on the gabapentin taking 300 mg twice daily, she will call for dose adjustments. The patient will follow-up in 2 months.  Dennie Bible, [facility], [facility] APRN  [facility] [address] [place], [place] [address] [phone]

## 2017-08-11 ENCOUNTER — Telehealth: Payer: Self-pay | Admitting: Neurology

## 2017-08-11 ENCOUNTER — Ambulatory Visit: Payer: Self-pay | Admitting: Nurse Practitioner

## 2017-08-11 NOTE — Telephone Encounter (Signed)
Received message from call center @ 7:26am saying that pt was outside our office for 7:15am appointment and the door was locked. I called her right back but she did not answer the phone. I left VM asking her to wait for a couple of minutes and someone will open the door for her soon. But she never called back and apparently did not stay for the appointment. Her appointment was actually 7:45am.   Marvel Plan, MD PhD Stroke Neurology 08/11/2017 8:20 AM

## 2017-08-11 NOTE — Telephone Encounter (Signed)
I spoke with Angie/billing and was advised the no show fee could be waived this one time only. I then called the pt to advised her. I relayed the msg but she interrupted me, said the office was not open when she came this morning at 7:11 and this is coming across like it was her fault she did not come to the appt today. I told her several patients had already been checked in prior to her arrival. Pt said she did not want to r/s the appt, she would send an email to Dr Anne Hahn and probably find another provider office. I apologized, she said thank you and we hung up.  FYI

## 2017-08-11 NOTE — Telephone Encounter (Signed)
Pt called said she was at the office at 7:11 and the front sliding doors were locked. Said she and another patient tried to open them but was unsuccessful. I skyped Gerry/front desk and she was at the office at 6:45 and had checked in 2 patients at 7:02. I advised this to the pt and told her the appt was at 7:45 and she left before her appt time. She said I was told to be there at 7:15, I allowed 1 hr for my time, I would have to make other arrangements at work and I left. I did what I was suppose to. I advised her of the no show policy, pt said she is not going to be billed for this appt, she was at the clinic at the time she was given. I told the pt I would speak with bilIing and call her back about r/s the appt.

## 2017-08-12 ENCOUNTER — Encounter: Payer: Self-pay | Admitting: Nurse Practitioner

## 2017-08-15 ENCOUNTER — Encounter: Payer: Self-pay | Admitting: Neurology

## 2017-08-25 ENCOUNTER — Ambulatory Visit
Admission: RE | Admit: 2017-08-25 | Discharge: 2017-08-25 | Disposition: A | Discharge status: Home / Self Care | Payer: PRIVATE HEALTH INSURANCE | Source: Ambulatory Visit | Attending: Family Medicine | Admitting: Family Medicine

## 2017-08-25 DIAGNOSIS — Z1231 Encounter for screening mammogram for malignant neoplasm of breast: Secondary | ICD-10-CM

## 2017-11-03 ENCOUNTER — Telehealth: Payer: PRIVATE HEALTH INSURANCE | Admitting: Family

## 2017-11-03 DIAGNOSIS — J029 Acute pharyngitis, unspecified: Secondary | ICD-10-CM

## 2017-11-03 MED ORDER — BENZONATATE 100 MG PO CAPS: 100.0000 mg | ORAL_CAPSULE | Freq: Three times a day (TID) | ORAL | 0 refills | Status: DC | PRN

## 2017-11-03 MED ORDER — PREDNISONE 5 MG PO TABS: 5.0000 mg | ORAL_TABLET | ORAL | 0 refills | Status: DC

## 2017-11-03 NOTE — Progress Notes (Signed)
Thank you for the details you included in the comment boxes. Those details are very helpful in determining the best course of treatment for you and help us to provide the best care.  We are sorry that you are not feeling well.  Here is how we plan to help!  Based on your presentation I believe you most likely have A cough due to a virus.  This is called viral bronchitis and is best treated by rest, plenty of fluids and control of the cough.  You may use Ibuprofen or Tylenol as directed to help your symptoms.     In addition you may use A non-prescription cough medication called Mucinex DM: take 2 tablets every 12 hours. and A prescription cough medication called Tessalon Perles 100mg. You may take 1-2 capsules every 8 hours as needed for your cough.  Sterapred 5 mg dosepak  From your responses in the eVisit questionnaire you describe inflammation in the upper respiratory tract which is causing a significant cough.  This is commonly called Bronchitis and has four common causes:    Allergies  Viral Infections  Acid Reflux  Bacterial Infection Allergies, viruses and acid reflux are treated by controlling symptoms or eliminating the cause. An example might be a cough caused by taking certain blood pressure medications. You stop the cough by changing the medication. Another example might be a cough caused by acid reflux. Controlling the reflux helps control the cough.  USE OF BRONCHODILATOR ("RESCUE") INHALERS: There is a risk from using your bronchodilator too frequently.  The risk is that over-reliance on a medication which only relaxes the muscles surrounding the breathing tubes can reduce the effectiveness of medications prescribed to reduce swelling and congestion of the tubes themselves.  Although you feel brief relief from the bronchodilator inhaler, your asthma may actually be worsening with the tubes becoming more swollen and filled with mucus.  This can delay other crucial treatments, such  as oral steroid medications. If you need to use a bronchodilator inhaler daily, several times per day, you should discuss this with your provider.  There are probably better treatments that could be used to keep your asthma under control.     HOME CARE . Only take medications as instructed by your medical team. . Complete the entire course of an antibiotic. . Drink plenty of fluids and get plenty of rest. . Avoid close contacts especially the very young and the elderly . Cover your mouth if you cough or cough into your sleeve. . Always remember to wash your hands . A steam or ultrasonic humidifier can help congestion.   GET HELP RIGHT AWAY IF: . You develop worsening fever. . You become short of breath . You cough up blood. . Your symptoms persist after you have completed your treatment plan MAKE SURE YOU   Understand these instructions.  Will watch your condition.  Will get help right away if you are not doing well or get worse.  Your e-visit answers were reviewed by a board certified advanced clinical practitioner to complete your personal care plan.  Depending on the condition, your plan could have included both over the counter or prescription medications. If there is a problem please reply  once you have received a response from your provider. Your safety is important to us.  If you have drug allergies check your prescription carefully.    You can use MyChart to ask questions about today's visit, request a non-urgent call back, or ask for a work   or school excuse for 24 hours related to this e-Visit. If it has been greater than 24 hours you will need to follow up with your provider, or enter a new e-Visit to address those concerns. You will get an e-mail in the next two days asking about your experience.  I hope that your e-visit has been valuable and will speed your recovery. Thank you for using e-visits.   

## 2017-11-26 ENCOUNTER — Other Ambulatory Visit: Payer: Self-pay | Admitting: Neurology

## 2018-05-05 ENCOUNTER — Other Ambulatory Visit: Payer: Self-pay | Admitting: Neurology

## 2018-05-11 ENCOUNTER — Encounter: Payer: Self-pay | Admitting: Family Medicine

## 2018-05-11 ENCOUNTER — Ambulatory Visit (INDEPENDENT_AMBULATORY_CARE_PROVIDER_SITE_OTHER): Payer: PRIVATE HEALTH INSURANCE | Admitting: Family Medicine

## 2018-05-11 VITALS — BP 122/62 | HR 82 | Temp 98.1°F | Resp 14 | Ht 69.0 in | Wt 251.0 lb

## 2018-05-11 DIAGNOSIS — R350 Frequency of micturition: Secondary | ICD-10-CM | POA: Diagnosis not present

## 2018-05-11 DIAGNOSIS — G500 Trigeminal neuralgia: Secondary | ICD-10-CM | POA: Diagnosis not present

## 2018-05-11 DIAGNOSIS — Z23 Encounter for immunization: Secondary | ICD-10-CM

## 2018-05-11 DIAGNOSIS — Z114 Encounter for screening for human immunodeficiency virus [HIV]: Secondary | ICD-10-CM | POA: Diagnosis not present

## 2018-05-11 DIAGNOSIS — Z6837 Body mass index (BMI) 37.0-37.9, adult: Secondary | ICD-10-CM | POA: Diagnosis not present

## 2018-05-11 DIAGNOSIS — Z0000 Encounter for general adult medical examination without abnormal findings: Secondary | ICD-10-CM

## 2018-05-11 DIAGNOSIS — N3281 Overactive bladder: Secondary | ICD-10-CM | POA: Diagnosis not present

## 2018-05-11 DIAGNOSIS — K219 Gastro-esophageal reflux disease without esophagitis: Secondary | ICD-10-CM | POA: Diagnosis not present

## 2018-05-11 LAB — URINALYSIS, ROUTINE W REFLEX MICROSCOPIC
BILIRUBIN URINE: NEGATIVE
GLUCOSE, UA: NEGATIVE
HGB URINE DIPSTICK: NEGATIVE
Ketones, ur: NEGATIVE
Leukocytes, UA: NEGATIVE
Nitrite: NEGATIVE
PH: 7 (ref 5.0–8.0)
Protein, ur: NEGATIVE
Specific Gravity, Urine: 1.015 (ref 1.001–1.03)

## 2018-05-11 MED ORDER — PANTOPRAZOLE SODIUM 40 MG PO TBEC: DELAYED_RELEASE_TABLET | ORAL | 3 refills | Status: DC

## 2018-05-11 NOTE — Progress Notes (Signed)
   Subjective:    Patient ID: Beverly Mclaughlin, female    DOB: 1971-07-21, 47 y.o.   MRN: [id number]  Patient presents for Annual Exam Pt here for CPE  Medications reviewed   She is following with neurology for her trigeminal neuralgia, on gabapentin due for follow up, but symptoms have been controlled   GERD- still taking protonix as prescribed  Obesity- still exercising regulary, has been eating some sweets,     Weight up about 20lbs    Mammogram- UTD in Oct 2018  Urinates a lot, feels like she cant hold it at times.no dsyuria , Has to change underwear.She has been on OAB meds in past      Mother has Multiple Myeloma   Immunizations- due for TDAP  Due for fasting labs      Review Of Systems:  GEN- denies fatigue, fever, weight loss,weakness, recent illness HEENT- denies eye drainage, change in vision, nasal discharge, CVS- denies chest pain, palpitations RESP- denies SOB, cough, wheeze ABD- denies N/V, change in stools, abd pain GU- denies dysuria, hematuria, dribbling, incontinence MSK- denies joint pain, muscle aches, injury Neuro- denies headache, dizziness, syncope, seizure activity       Objective:    BP 122/62   Pulse 82   Temp 98.1 F (36.7 C) (Oral)   Resp 14   Ht '5\' 9"'$  (1.753 m)   Wt 251 lb (113.9 kg)   LMP 11/15/2015   SpO2 100%   BMI 37.07 kg/m  GEN- NAD, alert and oriented x3 HEENT- PERRL, EOMI, non injected sclera, pink conjunctiva, MMM, oropharynx clear, TM clear bilat no effusion, nares clear  Neck- Supple, no thyromegaly CVS- RRR, no murmur RESP-CTAB ABD-NABS,soft,NT,ND EXT- No edema Pulses- Radial, DP- 2+        Assessment & Plan:      Problem List Items Addressed This Visit      Unprioritized   Acid reflux    Continue PPI      Relevant Medications   pantoprazole (PROTONIX) 40 MG tablet   Obesity    Gained 20lbs, discussed diet, cut out junk food      Routine general medical examination at a health care facility - Primary     CPE done, immunizations- TDAP given Return for fasting labs in AM      Relevant Orders   Comprehensive metabolic panel   CBC with Differential/Platelet   Lipid panel   Tdap vaccine greater than or equal to 7yo IM (Completed)   Trigeminal neuralgia    F/u neurology       Other Visit Diagnoses    Urinary frequency       Relevant Orders   Urinalysis, Routine w reflex microscopic (Completed)   Encounter for screening for HIV       Relevant Orders   HIV antibody   Need for tetanus, diphtheria, and acellular pertussis (Tdap) vaccine in patient of adolescent age or older       Relevant Orders   Tdap vaccine greater than or equal to 7yo IM (Completed)   OAB (overactive bladder)       ua NEG, CHECK LABS, will give samples of myrbetriq '50mg'$        Note: This dictation was prepared with Dragon dictation along with smaller phrase technology. Any transcriptional errors that result from this process are unintentional.

## 2018-05-11 NOTE — Assessment & Plan Note (Addendum)
CPE done, immunizations- TDAP given Return for fasting labs in AM

## 2018-05-11 NOTE — Assessment & Plan Note (Signed)
Gained 20lbs, discussed diet, cut out junk food

## 2018-05-11 NOTE — Patient Instructions (Signed)
Return for fasting labs in the morning  TDAP We will call with urine results  F/U 1 year for physical

## 2018-05-11 NOTE — Assessment & Plan Note (Signed)
Continue PPI ?

## 2018-05-11 NOTE — Assessment & Plan Note (Signed)
F/u neurology 

## 2018-05-12 ENCOUNTER — Other Ambulatory Visit: Payer: Self-pay

## 2018-06-08 ENCOUNTER — Telehealth: Payer: Self-pay | Admitting: Neurology

## 2018-06-08 DIAGNOSIS — R9089 Other abnormal findings on diagnostic imaging of central nervous system: Secondary | ICD-10-CM

## 2018-06-08 DIAGNOSIS — R202 Paresthesia of skin: Secondary | ICD-10-CM

## 2018-06-08 NOTE — Telephone Encounter (Signed)
[employer] order sent to GI. They obtain the auth and will reach out to the pt to schedule.  °

## 2018-06-08 NOTE — Telephone Encounter (Signed)
I called the patient.  She is amenable to having a follow-up MRI of the brain done, will compare to the one done last year.  Multiple white matter lesions were noted.

## 2018-06-18 ENCOUNTER — Ambulatory Visit
Admission: RE | Admit: 2018-06-18 | Discharge: 2018-06-18 | Disposition: A | Discharge status: Home / Self Care | Payer: PRIVATE HEALTH INSURANCE | Source: Ambulatory Visit | Attending: Neurology | Admitting: Neurology

## 2018-06-18 DIAGNOSIS — R202 Paresthesia of skin: Secondary | ICD-10-CM

## 2018-06-18 DIAGNOSIS — R9089 Other abnormal findings on diagnostic imaging of central nervous system: Secondary | ICD-10-CM

## 2018-06-19 ENCOUNTER — Telehealth: Payer: Self-pay | Admitting: Neurology

## 2018-06-19 NOTE — Telephone Encounter (Signed)
  I called the patient.  MRI of the brain is unchanged from July 2018.  The white matter changes are quite stable, may wait another couple years and recheck again.  MRI brain 06/18/18:  IMPRESSION:   MRI brain (without) demonstrating: - Stable, scattered periventricular and subcortical foci of non-specific T2 hyperintensities. Considerations include autoimmune, inflammatory, post-infectious, microvascular ischemic or migraine associated etiologies.  - No acute findings. - No change from MRI on 05/13/17.

## 2018-07-15 ENCOUNTER — Other Ambulatory Visit: Payer: Self-pay | Admitting: Neurology

## 2019-05-21 ENCOUNTER — Other Ambulatory Visit: Payer: Self-pay

## 2019-05-21 DIAGNOSIS — Z20822 Contact with and (suspected) exposure to covid-19: Secondary | ICD-10-CM

## 2019-05-24 LAB — NOVEL CORONAVIRUS, NAA: SARS-CoV-2, NAA: NOT DETECTED

## 2019-07-20 ENCOUNTER — Other Ambulatory Visit: Payer: Self-pay

## 2019-07-20 DIAGNOSIS — R6889 Other general symptoms and signs: Secondary | ICD-10-CM | POA: Diagnosis not present

## 2019-07-20 DIAGNOSIS — Z20822 Contact with and (suspected) exposure to covid-19: Secondary | ICD-10-CM

## 2019-07-21 LAB — NOVEL CORONAVIRUS, NAA: SARS-CoV-2, NAA: NOT DETECTED

## 2019-08-20 ENCOUNTER — Ambulatory Visit (INDEPENDENT_AMBULATORY_CARE_PROVIDER_SITE_OTHER): Payer: Self-pay | Admitting: Family Medicine

## 2019-08-20 ENCOUNTER — Other Ambulatory Visit: Payer: Self-pay

## 2019-08-20 ENCOUNTER — Encounter: Payer: Self-pay | Admitting: Family Medicine

## 2019-08-20 VITALS — BP 122/64 | HR 88 | Temp 98.8°F | Resp 14 | Ht 69.0 in | Wt 270.0 lb

## 2019-08-20 DIAGNOSIS — M25511 Pain in right shoulder: Secondary | ICD-10-CM

## 2019-08-20 DIAGNOSIS — G8929 Other chronic pain: Secondary | ICD-10-CM

## 2019-08-20 DIAGNOSIS — M7541 Impingement syndrome of right shoulder: Secondary | ICD-10-CM

## 2019-08-20 MED ORDER — TRAMADOL HCL 50 MG PO TABS: 50.0000 mg | ORAL_TABLET | Freq: Three times a day (TID) | ORAL | 0 refills | Status: AC | PRN

## 2019-08-20 MED ORDER — GABAPENTIN 600 MG PO TABS: 600.0000 mg | ORAL_TABLET | Freq: Three times a day (TID) | ORAL | 1 refills | Status: DC

## 2019-08-20 NOTE — Patient Instructions (Signed)
Referral to orthopedics - Dr. Aline Brochure  Restart gabapentin Ultram for pain F/U schedule a physical

## 2019-08-20 NOTE — Progress Notes (Signed)
   Subjective:    Patient ID: Beverly Mclaughlin, female    DOB: 1971/10/18, 48 y.o.   MRN: [id number]  Patient presents for R Arm Pain (x months- R arm pain from shoulder down arm- hand will tingle- pian worse at night- burning sensation to arm)    Right arm/ shoulder  pain for the past 2 months. Pain is worse at night, radiates from upper arm down to hand, occ tingling and numbness in all her fingers  If she is driving has pain or sitting at her desk Occ neck pain  No particular injury, that she could recall  thought is was maybe mattress or pillows,but no change with adjusting those   she has been taking gabapentin a few doses which helped a little  Tried NSAIDS, muscle rubs - those did not help as much, her gabapentin did help some- needs this refilled  GERD- has been off for  > 6 months, has not had any difficulty with reflux  She does take apple cider gummies        She is working a new job now in [employer]   Also is school     Review Of Systems:  GEN- denies fatigue, fever, weight loss,weakness, recent illness HEENT- denies eye drainage, change in vision, nasal discharge, CVS- denies chest pain, palpitations RESP- denies SOB, cough, wheeze ABD- denies N/V, change in stools, abd pain GU- denies dysuria, hematuria, dribbling, incontinence MSK- + joint pain, muscle aches, injury Neuro- denies headache, dizziness, syncope, seizure activity       Objective:    BP 122/64   Pulse 88   Temp 98.8 F (37.1 C) (Oral)   Resp 14   Ht 5\' 9"  (1.753 m)   Wt 270 lb (122.5 kg)   LMP 11/15/2015   SpO2 99%   BMI 39.87 kg/m  GEN- NAD, alert and oriented x3 HEENT- PERRL, EOMI, non injected sclera, pink conjunctiva, MMM, oropharynx clear Neck- Supple, no thyromegaly, good ROM CVS- RRR, no murmur RESP-CTAB MSK- decreased ROM RUE compared to left, positive neers, equivical empty can, biceps in tact, FROM LUE Neuro-normal tone Upper ext, normal monofilament, neg  tinels/phalens, stregnth decreased in RUE compared to left  EXT- No edema Pulses- Radial, DP- 2+        Assessment & Plan:      Problem List Items Addressed This Visit    None    Visit Diagnoses    Chronic right shoulder pain    -  Primary   Referral to orthopedics, concern for some impingment, considerd carpal tunnel but most manuevers procedure shoulder pain with radiation. Refilled gabapentin, ultram at bedtime   Relevant Medications   gabapentin (NEURONTIN) 600 MG tablet   traMADol (ULTRAM) 50 MG tablet   Other Relevant Orders   Ambulatory referral to Orthopedic Surgery   Shoulder impingement syndrome, right       Relevant Orders   Ambulatory referral to Orthopedic Surgery      Note: This dictation was prepared with Dragon dictation along with smaller phrase technology. Any transcriptional errors that result from this process are unintentional.

## 2019-08-21 ENCOUNTER — Encounter: Payer: Self-pay | Admitting: Family Medicine

## 2019-09-08 ENCOUNTER — Ambulatory Visit: Payer: BC Managed Care – PPO

## 2019-09-08 ENCOUNTER — Encounter: Payer: Self-pay | Admitting: Orthopedic Surgery

## 2019-09-08 ENCOUNTER — Other Ambulatory Visit: Payer: Self-pay

## 2019-09-08 ENCOUNTER — Ambulatory Visit (INDEPENDENT_AMBULATORY_CARE_PROVIDER_SITE_OTHER): Payer: BC Managed Care – PPO | Admitting: Orthopedic Surgery

## 2019-09-08 VITALS — BP 131/84 | HR 70 | Ht 68.0 in | Wt 262.0 lb

## 2019-09-08 DIAGNOSIS — G8929 Other chronic pain: Secondary | ICD-10-CM | POA: Diagnosis not present

## 2019-09-08 DIAGNOSIS — K219 Gastro-esophageal reflux disease without esophagitis: Secondary | ICD-10-CM | POA: Insufficient documentation

## 2019-09-08 DIAGNOSIS — M25511 Pain in right shoulder: Secondary | ICD-10-CM | POA: Diagnosis not present

## 2019-09-08 DIAGNOSIS — M7541 Impingement syndrome of right shoulder: Secondary | ICD-10-CM

## 2019-09-08 MED ORDER — MELOXICAM 7.5 MG PO TABS: 7.5000 mg | ORAL_TABLET | Freq: Every day | ORAL | 5 refills | Status: DC

## 2019-09-08 MED ORDER — TRAMADOL HCL 50 MG PO TABS: 50.0000 mg | ORAL_TABLET | Freq: Four times a day (QID) | ORAL | 0 refills | Status: AC | PRN

## 2019-09-08 NOTE — Progress Notes (Signed)
Beverly Mclaughlin  09/08/2019  Body mass index is 39.84 kg/m.   HISTORY SECTION :  Chief Complaint  Patient presents with  . Shoulder Pain    right x 3 months  no injury    48 year old female comes in with a 48-month history of 8 out of 10 pain in the right shoulder no associated trauma did not get any relief with ibuprofen or liniment  She did take some tramadol for nighttime pain and some gabapentin for tingling she was having in the arm when the pain first started that has subsequently resolved  Her activities of daily living are causing her some difficulty with putting her bra on or washing her back and reaching in the backseat of a car she is not having any significant limitations in flexion of the shoulder   Review of Systems  Neurological: Positive for tingling.  All other systems reviewed and are negative.    has a past medical history of Anemia, Facial pain, and Trigeminal neuralgia (04/29/2017).   Past Surgical History:  Procedure Laterality Date  . ABDOMINAL HYSTERECTOMY  Jan 2017   -[facility]   . TUBAL LIGATION      Body mass index is 39.84 kg/m.   Allergies  Allergen Reactions  . Prilosec [Omeprazole] Hives and Rash     Current Outpatient Medications:  .  gabapentin (NEURONTIN) 600 MG tablet, Take 1 tablet (600 mg total) by mouth 3 (three) times daily., Disp: 270 tablet, Rfl: 1 .  Multiple Vitamins-Minerals (MULTIVITAMIN WITH MINERALS) tablet, Take 1 tablet by mouth daily., Disp: , Rfl:  .  traMADol (ULTRAM) 50 MG tablet, Take by mouth every 6 (six) hours as needed., Disp: , Rfl:    PHYSICAL EXAM SECTION: 1) BP 131/84   Pulse 70   Ht 5\' 8"  (1.727 m)   Wt 262 lb (118.8 kg)   LMP 11/15/2015   BMI 39.84 kg/m   Body mass index is 39.84 kg/m. General appearance: Well-developed well-nourished no gross deformities  2) Cardiovascular normal pulse and perfusion in the upper extremities normal color without edema  3) Neurologically deep tendon  reflexes are equal and normal, no sensation loss or deficits no pathologic reflexes  4) Psychological: Awake alert and oriented x3 mood and affect normal  5) Skin no lacerations or ulcerations no nodularity no palpable masses, no erythema or nodularity  6) Musculoskeletal:   Right shoulder Skin is normal Palpable tenderness in the rotator interval Range of motion is normal for flexion external rotation with the arm at the side she has some pain with abduction external rotation and she has decreased internal rotation compared to the left which has full internal rotation up to T7 There is no instability with abduction external rotation of the arm  Provocative tests for impingement were positive  Drop test was normal  Cuff strength was normal but she did have pain in abduction external rotation so-called empty can position   MEDICAL DECISION SECTION:  Encounter Diagnosis  Name Primary?  . Chronic right shoulder pain Yes    Imaging No acute changes see x-ray report  Plan:  (Rx., Inj., surg., Frx, MRI/CT, XR:2)  Subacromial injection right shoulder Different NSAID Refill tramadol Home exercise program Return as needed should resolve self-limiting   Procedure note the subacromial injection shoulder RIGHT    Verbal consent was obtained to inject the  RIGHT   Shoulder  Timeout was completed to confirm the injection site is a subacromial space of the  RIGHT  shoulder  Medication used Depo-Medrol 40 mg and lidocaine 1% 3 cc  Anesthesia was provided by ethyl chloride  The injection was performed in the RIGHT  posterior subacromial space. After pinning the skin with alcohol and anesthetized the skin with ethyl chloride the subacromial space was injected using a 20-gauge needle. There were no complications  Sterile dressing was applied.   9:59 AM Fuller Canada, MD  09/08/2019

## 2019-09-08 NOTE — Patient Instructions (Addendum)
You have received an injection of steroids into the joint. 15% of patients will have increased pain within the 24 hours postinjection.   This is transient and will go away.   We recommend that you use ice packs on the injection site for 20 minutes every 2 hours and extra strength Tylenol 2 tablets every 8 as needed until the pain resolves.  If you continue to have pain after taking the Tylenol and using the ice please call the office for further instructions.   Shoulder Impingement Syndrome  Shoulder impingement syndrome is a condition that causes pain when connective tissues (tendons) surrounding the shoulder joint become pinched. These tendons are part of the group of muscles and tissues that help to stabilize the shoulder (rotator cuff). Beneath the rotator cuff is a fluid-filled sac (bursa) that allows the muscles and tendons to glide smoothly. The bursa may become swollen or irritated (bursitis). Bursitis, swelling in the rotator cuff tendons, or both conditions can decrease how much space is under a bone in the shoulder joint (acromion), resulting in impingement. What are the causes? Shoulder impingement syndrome may be caused by bursitis or swelling of the rotator cuff tendons, which may result from:  Repetitive overhead arm movements.  Falling onto the shoulder.  Weakness in the shoulder muscles. What increases the risk? You may be more likely to develop this condition if you:  Play sports that involve throwing, such as baseball.  Participate in sports such as tennis, volleyball, and swimming.  Work as a Curator, Games developer, or Architect. Some people are also more likely to develop impingement syndrome because of the shape of their acromion bone. What are the signs or symptoms? The main symptom of this condition is pain on the front or side of the shoulder. The pain may:  Get worse when lifting or raising the arm.  Get worse at night.  Wake you up from sleeping.  Feel  sharp when the shoulder is moved and then fade to an ache. Other symptoms may include:  Tenderness.  Stiffness.  Inability to raise the arm above shoulder level or behind the body.  Weakness. How is this diagnosed? This condition may be diagnosed based on:  Your symptoms and medical history.  A physical exam.  Imaging tests, such as: ? X-rays. ? MRI. ? Ultrasound. How is this treated? This condition may be treated by:  Resting your shoulder and avoiding all activities that cause pain or put stress on the shoulder.  Icing your shoulder.  NSAIDs to help reduce pain and swelling.  One or more injections of medicines to numb the area and reduce inflammation.  Physical therapy.  Surgery. This may be needed if nonsurgical treatments have not helped. Surgery may involve repairing the rotator cuff, reshaping the acromion, or removing the bursa. Follow these instructions at home: Managing pain, stiffness, and swelling   If directed, put ice on the injured area. ? Put ice in a plastic bag. ? Place a towel between your skin and the bag. ? Leave the ice on for 20 minutes, 2-3 times a day. Activity  Rest and return to your normal activities as told by your health care provider. Ask your health care provider what activities are safe for you.  Do exercises as told by your health care provider. General instructions  Do not use any products that contain nicotine or tobacco, such as cigarettes, e-cigarettes, and chewing tobacco. These can delay healing. If you need help quitting, ask your health care provider.  Ask your health care provider when it is safe for you to drive.  Take over-the-counter and prescription medicines only as told by your health care provider.  Keep all follow-up visits as told by your health care provider. This is important. How is this prevented?  Give your body time to rest between periods of activity.  Be safe and responsible while being active.  This will help you avoid falls.  Maintain physical fitness, including strength and flexibility. Contact a health care provider if:  Your symptoms have not improved after 1-2 months of treatment and rest.  You cannot lift your arm away from your body. Summary  Shoulder impingement syndrome is a condition that causes pain when connective tissues (tendons) surrounding the shoulder joint become pinched.  The main symptom of this condition is pain on the front or side of the shoulder.  This condition is usually treated with rest, ice, and pain medicines as needed. This information is not intended to replace advice given to you by your health care provider. Make sure you discuss any questions you have with your health care provider. Document Released: 10/14/2005 Document Revised: 02/05/2019 Document Reviewed: 04/08/2018 Elsevier Patient Education  2020 [employer]    Shoulder Range of Motion Exercises Shoulder range of motion (ROM) exercises are done to keep the shoulder moving freely or to increase movement. They are often recommended for people who have shoulder pain or stiffness or who are recovering from a shoulder surgery. Phase 1 exercises When you are able, do this exercise 1-2 times per day for 30-60 seconds in each direction, or as directed by your health care provider. Pendulum exercise To do this exercise while sitting: 1. Sit in a chair or at the edge of your bed with your feet flat on the floor. 2. Let your affected arm hang down in front of you over the edge of the bed or chair. 3. Relax your shoulder, arm, and hand. 4. Rock your body so your arm gently swings in small circles. You can also use your unaffected arm to start the motion. 5. Repeat changing the direction of the circles, swinging your arm left and right, and swinging your arm forward and back. To do this exercise while standing: 1. Stand next to a sturdy chair or table, and hold on to it with your hand on your  unaffected side. 2. Bend forward at the waist. 3. Bend your knees slightly. 4. Relax your shoulder, arm, and hand. 5. While keeping your shoulder relaxed, use body motion to swing your arm in small circles. 6. Repeat changing the direction of the circles, swinging your arm left and right, and swinging your arm forward and back. 7. Between exercises, stand up tall and take a short break to relax your lower back.  Phase 2 exercises Do these exercises 1-2 times per day or as told by your health care provider. Hold each stretch for 30 seconds, and repeat 3 times. Do the exercises with one or both arms as instructed by your health care provider. For these exercises, sit at a table with your hand and arm supported by the table. A chair that slides easily or has wheels can be helpful. External rotation 1. Turn your chair so that your affected side is nearest to the table. 2. Place your forearm on the table to your side. Bend your elbow about 90 at the elbow (right angle) and place your hand palm facing down on the table. Your elbow should be about 6 inches  away from your side. 3. Keeping your arm on the table, lean your body forward. Abduction 1. Turn your chair so that your affected side is nearest to the table. 2. Place your forearm and hand on the table so that your thumb points toward the ceiling and your arm is straight out to your side. 3. Slide your hand out to the side and away from you, using your unaffected arm to do the work. 4. To increase the stretch, you can slide your chair away from the table. Flexion: forward stretch 1. Sit facing the table. Place your hand and elbow on the table in front of you. 2. Slide your hand forward and away from you, using your unaffected arm to do the work. 3. To increase the stretch, you can slide your chair backward. Phase 3 exercises Do these exercises 1-2 times per day or as told by your health care provider. Hold each stretch for 30 seconds, and  repeat 3 times. Do the exercises with one or both arms as instructed by your health care provider. Cross-body stretch: posterior capsule stretch 1. Lift your arm straight out in front of you. 2. Bend your arm 90 at the elbow (right angle) so your forearm moves across your body. 3. Use your other arm to gently pull the elbow across your body, toward your other shoulder. Wall climbs 1. Stand with your affected arm extended out to the side with your hand resting on a door frame. 2. Slide your hand slowly up the door frame. 3. To increase the stretch, step through the door frame. Keep your body upright and do not lean. Wand exercises You will need a cane, a piece of PVC pipe, or a sturdy wooden dowel for wand exercises. Flexion To do this exercise while standing: 1. Hold the wand with both of your hands, palms down. 2. Using the other arm to help, lift your arms up and over your head, if able. 3. Push upward with your other arm to gently increase the stretch. To do this exercise while lying down: 1. Lie on your back with your elbows resting on the floor and the wand in both your hands. Your hands will be palm down, or pointing toward your feet. 2. Lift your hands toward the ceiling, using your unaffected arm to help if needed. 3. Bring your arms overhead as able, using your unaffected arm to help if needed. Internal rotation 1. Stand while holding the wand behind you with both hands. Your unaffected arm should be extended above your head with the arm of the affected side extended behind you at the level of your waist. The wand should be pointing straight up and down as you hold it. 2. Slowly pull the wand up behind your back by straightening the elbow of your unaffected arm and bending the elbow of your affected arm. External rotation 1. Lie on your back with your affected upper arm supported on a small pillow or rolled towel. When you first do this exercise, keep your upper arm close to your  body. Over time, bring your arm up to a 90 angle out to the side. 2. Hold the wand across your stomach and with both hands palm up. Your elbow on your affected side should be bent at a 90 angle. 3. Use your unaffected side to help push your forearm away from you and toward the floor. Keep your elbow on your affected side bent at a 90 angle. Contact a health care provider if you  have:  New or increasing pain.  New numbness, tingling, weakness, or discoloration in your arm or hand. This information is not intended to replace advice given to you by your health care provider. Make sure you discuss any questions you have with your health care provider. Document Released: 07/13/2003 Document Revised: 11/26/2017 Document Reviewed: 11/26/2017 Elsevier Patient Education  2020 [employer].

## 2019-10-13 NOTE — Progress Notes (Signed)
PATIENT: Beverly Mclaughlin DOB: 1971-08-01  REASON FOR VISIT: follow up HISTORY FROM: patient  Chief Complaint  Patient presents with  . Follow-up    Yearly f/u. Alone. Rm 1. Patient mentioned that she been having unbearable pain with her Trigeminal Neuralgia. She stated that her pain is a 3/10 but its normally a 10/10. She stated that her pain radiates from her the right side of her jaw back to her ear.       HISTORY OF PRESENT ILLNESS: Today 10/14/19 Beverly Mclaughlin is a 48 y.o. female here today for follow up of right sided face pain. She was seen in 04/2017 as a new patient. She was started on gabapentin and reports this worked very well. She discontinued gabapentin about 12-18 months ago. In October, 2020, she had her teeth cleaned. She has had significant worsening of right jaw pain. It starts at right lower lip and radiates to her right ear. She saw her PCP for jaw and shoulder pain. She was started on gabapentin 651m TID and Tramadol 518mat night. She did not get any relief and has increased dose.  She is now taking 180032mID (total 5400m19mily). She does feel that she can get through the day at this dose but pain continues. She denies adverse effects with increased dose. She denies history of liver or kidney disease. MRI in 2018 showed white matter changes and T2 flair hyper intensities. Additional testing not consistent with MS. She had a repeat MRI in 2019 that did not show any changes.   HISTORY: (copied from Dr WillJannifer Franklinte on 04/29/2017)  Beverly Mclaughlin 44 y40r old right-handed black female with a history of onset of right mid face discomfort that began in March 2018. The patient had just had a filling done on a tooth in the right maxillary area a few days prior to onset of symptoms. The patient has noted some sharp jabbing pains ago from the right upper lip to the orbital area on the right. The patient indicates that chewing or talking may increase the frequency of the pain. The  patient was seen back by her dentist and the tooth was filed down slightly without benefit, the patient then had an evaluation through an oral surgeon and her wisdom teeth were removed along with another molar but the pain continued. The patient was seen by an endodontist, no dental abnormalities were noted. The patient was set up for a CT of the maxillofacial area which was unremarkable. The patient denies any numbness of the face, she has not had any weakness or numbness of extremities. At times she may have some slight imbalance, she denies issues controlling the bowels or the bladder. She has been placed on low-dose gabapentin taking 200 mg daily, this may have started to benefit the pain slightly. She is sent to this office for further evaluation.   REVIEW OF SYSTEMS: Out of a complete 14 system review of symptoms, the patient complains only of the following symptoms, right sided facial pain and all other reviewed systems are negative.  ALLERGIES: Allergies  Allergen Reactions  . Prilosec [Omeprazole] Hives and Rash    HOME MEDICATIONS: Outpatient Medications Prior to Visit  Medication Sig Dispense Refill  . gabapentin (NEURONTIN) 600 MG tablet Take 1 tablet (600 mg total) by mouth 3 (three) times daily. (Patient taking differently: Take 600 mg by mouth 3 (three) times daily. 3 tablets by mouth 3 times a day) 270 tablet 1  . meloxicam (MOBIC) 7.5 MG  tablet Take 1 tablet (7.5 mg total) by mouth daily. 30 tablet 5  . Multiple Vitamins-Minerals (MULTIVITAMIN WITH MINERALS) tablet Take 1 tablet by mouth daily.    . traMADol (ULTRAM) 50 MG tablet Take 50 mg by mouth every 8 (eight) hours as needed.     No facility-administered medications prior to visit.    PAST MEDICAL HISTORY: Past Medical History:  Diagnosis Date  . Anemia   . Facial pain   . Trigeminal neuralgia 04/29/2017    Right V2    PAST SURGICAL HISTORY: Past Surgical History:  Procedure Laterality Date  . ABDOMINAL  HYSTERECTOMY  Jan 2017   -[facility]   . TUBAL LIGATION      FAMILY HISTORY: Family History  Problem Relation Age of Onset  . Heart disease Mother   . Diabetes Mother   . Multiple myeloma Mother   . Diabetes Father   . Kidney disease Father   . Hypertension Sister   . Diabetes Sister   . Cancer Maternal Grandmother   . Kidney disease Maternal Grandmother   . Diabetes Paternal Grandfather     SOCIAL HISTORY: Social History   Socioeconomic History  . Marital status: Divorced    Spouse name: Not on file  . Number of children: 2  . Years of education: some college  . Highest education level: Not on file  Occupational History  . Occupation: Teacher, early years/pre  Tobacco Use  . Smoking status: Never Smoker  . Smokeless tobacco: Never Used  Substance and Sexual Activity  . Alcohol use: Yes    Alcohol/week: 2.0 standard drinks    Types: 1 Glasses of wine, 1 Cans of beer per week    Comment: occasionally  . Drug use: No  . Sexual activity: Yes    Birth control/protection: Surgical  Other Topics Concern  . Not on file  Social History Narrative   Lives at home with two sons.   Right-handed.   2 cups coffee daily.   Social Determinants of Health   Financial Resource Strain:   . Difficulty of Paying Living Expenses: Not on file  Food Insecurity:   . Worried About Charity fundraiser in the Last Year: Not on file  . Ran Out of Food in the Last Year: Not on file  Transportation Needs:   . Lack of Transportation (Medical): Not on file  . Lack of Transportation (Non-Medical): Not on file  Physical Activity:   . Days of Exercise per Week: Not on file  . Minutes of Exercise per Session: Not on file  Stress:   . Feeling of Stress : Not on file  Social Connections:   . Frequency of Communication with Friends and Family: Not on file  . Frequency of Social Gatherings with Friends and Family: Not on file  . Attends Religious Services: Not on file  . Active Member  of Clubs or Organizations: Not on file  . Attends Archivist Meetings: Not on file  . Marital Status: Not on file  Intimate Partner Violence:   . Fear of Current or Ex-Partner: Not on file  . Emotionally Abused: Not on file  . Physically Abused: Not on file  . Sexually Abused: Not on file      PHYSICAL EXAM  Vitals:   10/14/19 0757  BP: 107/66  Pulse: 91  Temp: (!) 97.2 F (36.2 C)  TempSrc: Oral  Weight: 272 lb 9.6 oz (123.7 kg)  Height: _0  (1.727 m)   Body  mass index is 41.45 kg/m.  Generalized: Well developed, in no acute distress  Cardiology: normal rate and rhythm, no murmur noted Respiratory: clear to auscultation bilaterally  Neurological examination  Mentation: Alert oriented to time, place, history taking. Follows all commands speech and language fluent Cranial nerve II-XII: Pupils were equal round reactive to light. Extraocular movements were full, visual field were full on confrontational test. Facial sensation and strength were normal. Uvula tongue midline. Head turning and shoulder shrug  were normal and symmetric. Motor: The motor testing reveals 5 over 5 strength of all 4 extremities. Good symmetric motor tone is noted throughout.  Sensory: Sensory testing is intact to soft touch on all 4 extremities. No evidence of extinction is noted. Tenderness with palpation of right lower jaw line. No numbness  Gait and station: Gait is normal.   DIAGNOSTIC DATA (LABS, IMAGING, TESTING) - I reviewed patient records, labs, notes, testing and imaging myself where available.  No flowsheet data found.   Lab Results  Component Value Date   WBC 7.2 11/30/2015   HGB 13.6 03/03/2016   HCT 40.0 03/03/2016   MCV 98.1 11/30/2015   PLT 312 11/30/2015      Component Value Date/Time   NA 139 04/29/2017 1035   K 5.5 (H) 04/29/2017 1035   CL 100 04/29/2017 1035   CO2 25 04/29/2017 1035   GLUCOSE 94 04/29/2017 1035   GLUCOSE 80 03/03/2016 1821   BUN 11  04/29/2017 1035   CREATININE 0.82 04/29/2017 1035   CREATININE 0.75 05/31/2015 0830   CALCIUM 9.7 04/29/2017 1035   PROT 7.1 04/29/2017 1035   ALBUMIN 4.1 04/29/2017 1035   AST 18 04/29/2017 1035   ALT 10 04/29/2017 1035   ALKPHOS 50 04/29/2017 1035   BILITOT <0.2 04/29/2017 1035   GFRNONAA 85 04/29/2017 1035   GFRNONAA >89 05/31/2015 0830   GFRAA 99 04/29/2017 1035   GFRAA >89 05/31/2015 0830   Lab Results  Component Value Date   CHOL 129 05/31/2015   HDL 64 05/31/2015   LDLCALC 54 05/31/2015   TRIG 57 05/31/2015   CHOLHDL 2.0 05/31/2015   Lab Results  Component Value Date   HGBA1C 5.3 05/24/2014   No results found for: [id number] Lab Results  Component Value Date   TSH 0.974 05/31/2015     ASSESSMENT AND PLAN 48 y.o. year old female  has a past medical history of Anemia, Facial pain, and Trigeminal neuralgia (04/29/2017). here with     ICD-10-CM   1. Trigeminal neuralgia  G50.0     Beverly Mclaughlin has had an increase in trigeminal neuralgia pain following a dental cleaning in October.  She has been taking 5400 mg of gabapentin daily to get through the workday.  I have discussed the maximum daily dose of gabapentin with the patient.  After discussion with patient, I will add carbamazepine 200 mg twice daily.  She will continue gabapentin 600 mg 3 times daily.  We can titrate carbamazepine pending her response.  We have discussed need to update labs today.  She has a CPE next month with her primary care provider.  She would like to have labs drawn at that time.  I have discussed potential side effects of carbamazepine and gabapentin with patient.  Additional information provided in AVS.  She will follow-up with me in 3 months, sooner if needed.  She verbalizes understanding and agreement with this plan.   No orders of the defined types were placed in this encounter.  Meds ordered this encounter  Medications  . carbamazepine (TEGRETOL) 200 MG tablet    Sig: Take 1 tablet (200 mg  total) by mouth 2 (two) times daily. Start 249m once daily for 1 week then increase to 2065mtwice daily    Dispense:  60 tablet    Refill:  11    Order Specific Question:   Supervising Provider    Answer:   AHMelvenia Beam1[[id number]]    I spent 15 minutes with the patient. 50% of this time was spent counseling and educating patient on plan of care and medications.    Beverly PrestoFNP-C 10/14/2019, 8:59 AM [facility] [address]SuBolindalerJeff[place] [id number] [phone]

## 2019-10-14 ENCOUNTER — Ambulatory Visit: Payer: BC Managed Care – PPO | Admitting: Family Medicine

## 2019-10-14 ENCOUNTER — Other Ambulatory Visit: Payer: Self-pay

## 2019-10-14 ENCOUNTER — Encounter: Payer: Self-pay | Admitting: Family Medicine

## 2019-10-14 VITALS — BP 107/66 | HR 91 | Temp 97.2°F | Ht 68.0 in | Wt 272.6 lb

## 2019-10-14 DIAGNOSIS — G500 Trigeminal neuralgia: Secondary | ICD-10-CM

## 2019-10-14 MED ORDER — CARBAMAZEPINE 200 MG PO TABS: 200.0000 mg | ORAL_TABLET | Freq: Two times a day (BID) | ORAL | 11 refills | Status: DC

## 2019-10-14 NOTE — Progress Notes (Signed)
I have read the note, and I agree with the clinical assessment and plan.  Beverly Mclaughlin K [name]   

## 2019-10-14 NOTE — Patient Instructions (Addendum)
Continue gabapentin at  three times daily  Start carbamazepine  (once daily for 1 week, then increase to twice daily)   Labs with PCP next month at physical   Follow up in 3 months, sooner if needed.   Trigeminal Neuralgia  Trigeminal neuralgia is a nerve disorder that causes severe pain on one side of the face. The pain may last from a few seconds to several minutes. The pain is usually only on one side of the face. Symptoms may occur for days, weeks, or months and then go away for months or years. The pain may return and be worse than before. What are the causes? This condition is caused by damage or pressure to a nerve in the head that is called the trigeminal nerve. An attack can be triggered by:  Talking.  Chewing.  Putting on makeup.  Washing your face.  Shaving your face.  Brushing your teeth.  Touching your face. What increases the risk? You are more likely to develop this condition if you:  Are 29 years of age or older.  Are female. What are the signs or symptoms? The main symptom of this condition is severe pain in the:  Jaw.  Lips.  Eyes.  Nose.  Scalp.  Forehead.  Face. The pain may be:  Intense.  Stabbing.  Electric.  Shock-like. How is this diagnosed? This condition is diagnosed with a physical exam. A CT scan or an MRI may be done to rule out other conditions that can cause facial pain. How is this treated? This condition may be treated with:  Avoiding the things that trigger your symptoms.  Taking prescription medicines (anticonvulsants).  Having surgery. This may be done in severe cases if other medical treatment does not provide relief.  Having procedures such as ablation, thermal, or radiation therapy. It may take up to one month for treatment to start relieving the pain. Follow these instructions at home: Managing pain  Learn as much as you can about how to manage your pain. Ask your health care provider if a  pain specialist would be helpful.  Consider talking with a mental health care provider (psychologist) about how to cope with the pain.  Consider joining a pain support group. General instructions  Take over-the-counter and prescription medicines only as told by your health care provider.  Avoid the things that trigger your symptoms. It may help to: ? Chew on the unaffected side of your mouth. ? Avoid touching your face. ? Avoid blasts of hot or cold air.  Follow your treatment plan as told by your health care provider. This may include: ? Cognitive or behavioral therapy. ? Gentle, regular exercise. ? Meditation or yoga. ? Aromatherapy.  Keep all follow-up visits as told by your health care provider. You may need to be monitored closely to make sure treatment is working well for you. Where to find more information  Facial Pain Association: fpa-support.org Contact a health care provider if:  Your medicine is not helping your symptoms.  You have side effects from the medicine used for treatment.  You develop new, unexplained symptoms, such as: ? Double vision. ? Facial weakness. ? Facial numbness. ? Changes in hearing or balance.  You feel depressed. Get help right away if:  Your pain is severe and is not getting better.  You develop suicidal thoughts. If you ever feel like you may hurt yourself or others, or have thoughts about taking your own life, get help right away. You can go to  your nearest emergency department or call:  Your local emergency services (911 in the [place]  A suicide crisis helpline, such as the National Suicide Prevention Lifeline at 6814861072. This is open 24 hours a day. Summary  Trigeminal neuralgia is a nerve disorder that causes severe pain on one side of the face. The pain may last from a few seconds to several minutes.  This condition is caused by damage or pressure to a nerve in the head that is called the trigeminal  nerve.  Treatment may include avoiding the things that trigger your symptoms, taking medicines, or having surgery or procedures. It may take up to one month for treatment to start relieving the pain.  Avoid the things that trigger your symptoms.  Keep all follow-up visits as told by your health care provider. You may need to be monitored closely to make sure treatment is working well for you. This information is not intended to replace advice given to you by your health care provider. Make sure you discuss any questions you have with your health care provider. Document Released: 10/11/2000 Document Revised: 08/31/2018 Document Reviewed: 08/31/2018 Elsevier Patient Education  2020 [employer].  Carbamazepine tablets What is this medicine? CARBAMAZEPINE (kar ba MAZ e peen) is used to control seizures caused by certain types of epilepsy. This medicine is also used to treat nerve related pain. It is not for common aches and pains. This medicine may be used for other purposes; ask your health care provider or pharmacist if you have questions. COMMON BRAND NAME(S): Epitol, Tegretol What should I tell my health care provider before I take this medicine? They need to know if you have any of these conditions:  Asian ancestry  bone marrow disease  glaucoma  heart disease or irregular heartbeat  kidney disease  liver disease  low blood counts, like low white cell, platelet, or red cell counts  porphyria  psychotic disorders  suicidal thoughts, plans, or attempt; a previous suicide attempt by you or a family member  an unusual or allergic reaction to carbamazepine, tricyclic antidepressants, phenytoin, phenobarbital or other medicines, foods, dyes, or preservatives  pregnant or trying to get pregnant  breast-feeding How should I use this medicine? Take this medicine by mouth with a glass of water. Follow the directions on the prescription label. Take this medicine with food. Take  your doses at regular intervals. Do not take your medicine more often than directed. Do not stop taking this medicine except on the advice of your doctor or health care professional. A special MedGuide will be given to you by the pharmacist with each prescription and refill. Be sure to read this information carefully each time. Talk to your pediatrician regarding the use of this medicine in children. Special care may be needed. Overdosage: If you think you have taken too much of this medicine contact a poison control center or emergency room at once. NOTE: This medicine is only for you. Do not share this medicine with others. What if I miss a dose? If you miss a dose, take it as soon as you can. If it is almost time for your next dose, take only that dose. Do not take double or extra doses. What may interact with this medicine? Do not take this medicine with any of the following medications:  certain medicines used to treat HIV infection or AIDS that are given in combination with cobicistat   delavirdine   MAOIs like Carbex, Eldepryl, Marplan, Nardil, and Parnate   nefazodone  oxcarbazepine This medicine may also interact with the following medications:   acetaminophen   acetazolamide   barbiturate medicines for inducing sleep or treating seizures, like phenobarbital   certain antibiotics like clarithromycin, erythromycin or troleandomycin   cimetidine   cyclosporine   danazol   dicumarol   doxycycline   female hormones, including estrogens and birth control pills   grapefruit juice   isoniazid, INH   levothyroxine and other thyroid hormones   lithium and other medicines to treat mood problems or psychotic disturbances   loratadine   medicines for angina or high blood pressure   medicines for cancer   medicines for depression or anxiety   medicines for sleep   medicines to treat fungal infections, like fluconazole, itraconazole or  ketoconazole   medicines used to treat HIV infection or AIDS   methadone   niacinamide   praziquantel   propoxyphene   rifampin or rifabutin   seizure or epilepsy medicine   steroid medicines such as prednisone or cortisone   theophylline   tramadol   warfarin This list may not describe all possible interactions. Give your health care provider a list of all the medicines, herbs, non-prescription drugs, or dietary supplements you use. Also tell them if you smoke, drink alcohol, or use illegal drugs. Some items may interact with your medicine. What should I watch for while using this medicine? Visit your doctor or health care provider for a regular check on your progress. Do not change brands or dosage forms of this medicine without discussing the change with your doctor or health care provider. If you are taking this medicine for epilepsy (seizures), do not stop taking it suddenly. This increases the risk of seizures. Wear a [employer] Alert bracelet or necklace. Carry an identification card with information about your condition, medications, and doctor or health care provider. This medicine may cause serious skin reactions. They can happen weeks to months after starting the medicine. Contact your health care provider right away if you notice fevers or flu-like symptoms with a rash. The rash may be red or purple and then turn into blisters or peeling of the skin. Or, you might notice a red rash with swelling of the face, lips or lymph nodes in your neck or under your arms. You may get drowsy, dizzy, or have blurred vision. Do not drive, use machinery, or do anything that needs mental alertness until you know how this medicine affects you. To reduce dizzy or fainting spells, do not sit or stand up quickly, especially if you are an older patient. Alcohol can increase drowsiness and dizziness. Avoid alcoholic drinks. Birth control pills may not work properly while you are taking this  medicine. Talk to your doctor about using an extra method of birth control. This medicine can make you more sensitive to the sun. Keep out of the sun. If you cannot avoid being in the sun, wear protective clothing and use sunscreen. Do not use sun lamps or tanning beds/booths. The use of this medicine may increase the chance of suicidal thoughts or actions. Pay special attention to how you are responding while on this medicine. Any worsening of mood, or thoughts of suicide or dying should be reported to your health care provider right away. Women who become pregnant while using this medicine may enroll in the [place] American Antiepileptic Drug Pregnancy Registry by calling [phone]. This registry collects information about the safety of antiepileptic drug use during pregnancy. This medicine may cause a decrease in vitamin  D and folic acid. You should make sure that you get enough vitamins while you are taking this medicine. Discuss the foods you eat and the vitamins you take with your health care provider. What side effects may I notice from receiving this medicine? Side effects that you should report to your doctor or health care professional as soon as possible:  allergic reactions like skin rash, itching or hives, swelling of the face, lips, or tongue  breathing problems  changes in vision  confusion  dark urine  fast or irregular heartbeat  fever or chills, sore throat  mouth ulcers  pain or difficulty passing urine  rash, fever, and swollen lymph nodes  redness, blistering, peeling or loosening of the skin, including inside the mouth  ringing in the ears  seizures  stomach pain  swollen joints or muscle/joint aches and pains  unusual bleeding or bruising  unusually weak or tired  vomiting  worsening of mood, thoughts or actions of suicide or dying  yellowing of the eyes or skin Side effects that usually do not require medical attention (report to your doctor  or health care professional if they continue or are bothersome):  clumsiness or unsteadiness  diarrhea or constipation  headache  increased sweating  nausea This list may not describe all possible side effects. Call your doctor for medical advice about side effects. You may report side effects to FDA at 1-800-FDA-1088. Where should I keep my medicine? Keep out of reach of children. Store at room temperature below 30 degrees C (86 degrees F). Keep container tightly closed. Protect from moisture. Throw away any unused medicine after the expiration date. NOTE: This sheet is a summary. It may not cover all possible information. If you have questions about this medicine, talk to your doctor, pharmacist, or health care provider.  2020 Elsevier/Gold Standard (2019-01-14 09:05:49)

## 2019-10-19 ENCOUNTER — Ambulatory Visit: Payer: Self-pay | Admitting: Neurology

## 2019-10-24 ENCOUNTER — Encounter: Payer: Self-pay | Admitting: Family Medicine

## 2019-12-07 ENCOUNTER — Encounter: Payer: Self-pay | Admitting: Family Medicine

## 2019-12-07 ENCOUNTER — Ambulatory Visit (INDEPENDENT_AMBULATORY_CARE_PROVIDER_SITE_OTHER): Payer: BC Managed Care – PPO | Admitting: Family Medicine

## 2019-12-07 ENCOUNTER — Other Ambulatory Visit: Payer: Self-pay

## 2019-12-07 VITALS — BP 120/76 | HR 80 | Temp 98.6°F | Resp 16 | Ht 68.0 in | Wt 273.0 lb

## 2019-12-07 DIAGNOSIS — Z0000 Encounter for general adult medical examination without abnormal findings: Secondary | ICD-10-CM

## 2019-12-07 DIAGNOSIS — Z6841 Body Mass Index (BMI) 40.0 and over, adult: Secondary | ICD-10-CM | POA: Diagnosis not present

## 2019-12-07 DIAGNOSIS — G500 Trigeminal neuralgia: Secondary | ICD-10-CM | POA: Diagnosis not present

## 2019-12-07 DIAGNOSIS — Z1231 Encounter for screening mammogram for malignant neoplasm of breast: Secondary | ICD-10-CM

## 2019-12-07 DIAGNOSIS — Z1211 Encounter for screening for malignant neoplasm of colon: Secondary | ICD-10-CM

## 2019-12-07 MED ORDER — GABAPENTIN 600 MG PO TABS: 600.0000 mg | ORAL_TABLET | Freq: Every day | ORAL | 1 refills | Status: DC

## 2019-12-07 NOTE — Assessment & Plan Note (Addendum)
She has been able to reduce some of her medications and still have control over her symptoms.  Labs to be obtained I will forward these to her neurologist.

## 2019-12-07 NOTE — Patient Instructions (Signed)
Cologaurd to be ordered Schedule your mammogram We will call with lab results F/U 1 year for Physical

## 2019-12-07 NOTE — Assessment & Plan Note (Signed)
We discussed some dietary changes.  Decreasing some of the sugars in her diet which she admits that she has had some indiscretions.  Also her significant change in her activity with her new job and commute and not being able to go to the gym in setting of the pandemic is also played a role in the weight gain.  She is trying to do some activity from home.

## 2019-12-07 NOTE — Assessment & Plan Note (Signed)
CPE done.  Fasting labs to be obtained.  Patient to schedule her mammogram.  Cologuard to be done for colon cancer screening.  We did discuss shingles vaccine but will hold off at this time and address at next visit in setting of pandemic.

## 2019-12-07 NOTE — Progress Notes (Signed)
Subjective:    Patient ID: Beverly Mclaughlin, female    DOB: 1971/06/15, 49 y.o.   MRN: [id number]  Patient presents for Annual Exam (is fasting) Patient here for CPE.  She is due for fasting labs. Medications and history reviewed He is on Tegretol secondary to trigeminal neuralgia tried by neurology.She is taking 200mg  at bedtime and down to 1 gabapentin at bedtime.    Acid reflux no longer on PPI, no symptoms,she avoids foods that cause GI upset    She is due for colonoscopy now that she is 49 years of age. No longer requires Pap smears she is status post hysterectomy Due for mammogram Immunizations tetanus up-to-date flu shot up-to-date Discussed HIV screening  She continues to have Right shoulder ain, seen by orthopedics, has more pain at night, notes state impingement syndrome. She has meloxicam daily and prn ultram hand   Follows with dentist and eye doctor ( [facility] [place])  Review Of Systems:  GEN- denies fatigue, fever, weight loss,weakness, recent illness HEENT- denies eye drainage, change in vision, nasal discharge, CVS- denies chest pain, palpitations RESP- denies SOB, cough, wheeze ABD- denies N/V, change in stools, abd pain GU- denies dysuria, hematuria, dribbling, incontinence MSK- denies joint pain, muscle aches, injury Neuro- denies headache, dizziness, syncope, seizure activity       Objective:    BP 120/76   Pulse 80   Temp 98.6 F (37 C) (Temporal)   Resp 16   Ht 5\' 8"  (1.727 m)   Wt 273 lb (123.8 kg)   LMP 11/15/2015   SpO2 99%   BMI 41.51 kg/m  GEN- NAD, alert and oriented x3 HEENT- PERRL, EOMI, non injected sclera, pink conjunctiva, MMM, oropharynx clear Neck- Supple, no thyromegaly CVS- RRR, no murmur RESP-CTAB ABD-NABS,soft,NT,ND EXT- No edema Pulses- Radial, DP- 2+        Assessment & Plan:      Problem List Items Addressed This Visit      Unprioritized   Obesity    We discussed some dietary changes.   Decreasing some of the sugars in her diet which she admits that she has had some indiscretions.  Also her significant change in her activity with her new job and commute and not being able to go to the gym in setting of the pandemic is also played a role in the weight gain.  She is trying to do some activity from home.      Relevant Orders   Lipid panel   Hemoglobin A1c   TSH   Routine general medical examination at a health care facility - Primary    CPE done.  Fasting labs to be obtained.  Patient to schedule her mammogram.  Cologuard to be done for colon cancer screening.  We did discuss shingles vaccine but will hold off at this time and address at next visit in setting of pandemic.      Relevant Orders   CBC with Differential/Platelet   Comprehensive metabolic panel   Lipid panel   Hemoglobin A1c   TSH   HIV Antibody (routine testing w rflx)   Trigeminal neuralgia    She has been able to reduce some of her medications and still have control over her symptoms.  Labs to be obtained I will forward these to her neurologist.      Relevant Medications   gabapentin (NEURONTIN) 600 MG tablet    Other Visit Diagnoses    Encounter for screening mammogram for malignant neoplasm of  breast       Relevant Orders   MM 3D SCREEN BREAST BILATERAL   Colon cancer screening          Note: This dictation was prepared with Dragon dictation along with smaller phrase technology. Any transcriptional errors that result from this process are unintentional.

## 2019-12-08 LAB — COMPREHENSIVE METABOLIC PANEL
AG Ratio: 1.3 (calc) (ref 1.0–2.5)
ALT: 11 U/L (ref 6–29)
AST: 14 U/L (ref 10–35)
Albumin: 4.1 g/dL (ref 3.6–5.1)
Alkaline phosphatase (APISO): 58 U/L (ref 37–153)
BUN: 10 mg/dL (ref 7–25)
CO2: 27 mmol/L (ref 20–32)
Calcium: 9.4 mg/dL (ref 8.6–10.4)
Chloride: 103 mmol/L (ref 98–110)
Creat: 0.81 mg/dL (ref 0.50–1.05)
Globulin: 3.2 g/dL (calc) (ref 1.9–3.7)
Glucose, Bld: 89 mg/dL (ref 65–99)
Potassium: 5 mmol/L (ref 3.5–5.3)
Sodium: 138 mmol/L (ref 135–146)
Total Bilirubin: 0.4 mg/dL (ref 0.2–1.2)
Total Protein: 7.3 g/dL (ref 6.1–8.1)

## 2019-12-08 LAB — LIPID PANEL
Cholesterol: 194 mg/dL (ref <200)
HDL: 82 mg/dL (ref >=50)
LDL Cholesterol (Calc): 95 mg/dL (calc)
Non-HDL Cholesterol (Calc): 112 mg/dL (calc) (ref <130)
Total CHOL/HDL Ratio: 2.4 (calc) (ref <5.0)
Triglycerides: 78 mg/dL (ref <150)

## 2019-12-08 LAB — CBC WITH DIFFERENTIAL/PLATELET
Absolute Monocytes: 302 cells/uL (ref 200–950)
Basophils Absolute: 48 cells/uL (ref 0–200)
Basophils Relative: 0.9 %
Eosinophils Absolute: 21 cells/uL (ref 15–500)
Eosinophils Relative: 0.4 %
HCT: 37.4 % (ref 35.0–45.0)
Hemoglobin: 13 g/dL (ref 11.7–15.5)
Lymphs Abs: 1171 cells/uL (ref 850–3900)
MCH: 32.7 pg (ref 27.0–33.0)
MCHC: 34.8 g/dL (ref 32.0–36.0)
MCV: 94.2 fL (ref 80.0–100.0)
MPV: 10.1 fL (ref 7.5–12.5)
Monocytes Relative: 5.7 %
Neutro Abs: 3758 cells/uL (ref 1500–7800)
Neutrophils Relative %: 70.9 %
Platelets: 205 10*3/uL (ref 140–400)
RBC: 3.97 10*6/uL (ref 3.80–5.10)
RDW: 11.7 % (ref 11.0–15.0)
Total Lymphocyte: 22.1 %
WBC: 5.3 10*3/uL (ref 3.8–10.8)

## 2019-12-08 LAB — HEMOGLOBIN A1C
Hgb A1c MFr Bld: 5 % of total Hgb (ref <5.7)
Mean Plasma Glucose: 97 (calc)
eAG (mmol/L): 5.4 (calc)

## 2019-12-08 LAB — HIV ANTIBODY (ROUTINE TESTING W REFLEX): HIV 1&2 Ab, 4th Generation: NONREACTIVE

## 2019-12-08 LAB — TSH: TSH: 1.1 mIU/L

## 2019-12-09 ENCOUNTER — Telehealth: Payer: Self-pay | Admitting: *Deleted

## 2019-12-09 NOTE — Telephone Encounter (Signed)
Received verbal orders for Cologuard.   Order placed via [employer].   Cologuard (Order [id number])

## 2019-12-20 ENCOUNTER — Other Ambulatory Visit: Payer: Self-pay

## 2019-12-20 ENCOUNTER — Ambulatory Visit (HOSPITAL_COMMUNITY)
Admission: RE | Admit: 2019-12-20 | Discharge: 2019-12-20 | Disposition: A | Discharge status: Home / Self Care | Payer: BC Managed Care – PPO | Source: Ambulatory Visit | Attending: Family Medicine | Admitting: Family Medicine

## 2019-12-20 DIAGNOSIS — Z1231 Encounter for screening mammogram for malignant neoplasm of breast: Secondary | ICD-10-CM | POA: Insufficient documentation

## 2019-12-27 DIAGNOSIS — Z1212 Encounter for screening for malignant neoplasm of rectum: Secondary | ICD-10-CM | POA: Diagnosis not present

## 2019-12-27 DIAGNOSIS — Z1211 Encounter for screening for malignant neoplasm of colon: Secondary | ICD-10-CM | POA: Diagnosis not present

## 2019-12-27 LAB — COLOGUARD: Cologuard: NEGATIVE

## 2020-01-11 NOTE — Telephone Encounter (Signed)
Received the results of Cologuard screening.   Screening noted negative.   A negative result indicates a low likelihood of colorectal cancer is present. Following a negative Cologuard result, the American Cancer Society recommends a Cologuard re-screening interval of 3 years.   Letter sent.   

## 2020-01-12 ENCOUNTER — Ambulatory Visit: Payer: BC Managed Care – PPO | Admitting: Family Medicine

## 2020-03-14 ENCOUNTER — Encounter: Payer: Self-pay | Admitting: Family Medicine

## 2020-06-20 ENCOUNTER — Telehealth: Payer: Self-pay | Admitting: Neurology

## 2020-06-20 NOTE — Telephone Encounter (Signed)
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  Prior lumbar puncture was nondiagnostic.  I cannot reach the patient, the telephone number I have in the system is no longer valid.

## 2020-10-09 ENCOUNTER — Ambulatory Visit: Payer: BC Managed Care – PPO | Admitting: Family Medicine

## 2020-10-10 ENCOUNTER — Ambulatory Visit: Payer: BC Managed Care – PPO | Admitting: Family Medicine

## 2020-10-19 ENCOUNTER — Ambulatory Visit (INDEPENDENT_AMBULATORY_CARE_PROVIDER_SITE_OTHER): Payer: BC Managed Care – PPO | Admitting: Nurse Practitioner

## 2020-10-19 ENCOUNTER — Encounter: Payer: Self-pay | Admitting: Nurse Practitioner

## 2020-10-19 ENCOUNTER — Other Ambulatory Visit: Payer: Self-pay

## 2020-10-19 VITALS — BP 128/80 | HR 100 | Temp 98.4°F | Ht 68.0 in | Wt 273.8 lb

## 2020-10-19 DIAGNOSIS — M79661 Pain in right lower leg: Secondary | ICD-10-CM | POA: Diagnosis not present

## 2020-10-19 NOTE — Patient Instructions (Signed)
Hematoma A hematoma is a collection of blood under the skin, in an organ, in a body space, in a joint space, or in other tissue. The blood can thicken (clot) to form a lump that you can see and feel. The lump is often firm and may become sore and tender. Most hematomas get better in a few days to weeks. However, some hematomas may be serious and require medical care. Hematomas can range from very small to very large. What are the causes? This condition is caused by:  A blunt or penetrating injury.  A leakage from a blood vessel under the skin.  Some medical procedures, including surgeries, such as oral surgery, face lifts, and surgeries on the joints.  Some medical conditions that cause bleeding or bruising. There may be multiple hematomas that appear in different areas of the body. What increases the risk? You are more likely to develop this condition if:  You are an older adult.  You use blood thinners. What are the signs or symptoms?  Symptoms of this condition depend on where the hematoma is located.  Common symptoms of a hematoma that is under the skin include:  A firm lump on the body.  Pain and tenderness in the area.  Bruising. Blue, dark blue, purple-red, or yellowish skin (discoloration) may appear at the site of the hematoma if the hematoma is close to the surface of the skin. Common symptoms of a hematoma that is deep in the tissues or body spaces may be less obvious. They include:  A collection of blood in the stomach (intra-abdominal hematoma). This may cause pain in the abdomen, weakness, fainting, and shortness of breath.  A collection of blood in the head (intracranial hematoma). This may cause a headache or symptoms such as weakness, trouble speaking or understanding, or a change in consciousness. How is this diagnosed? This condition is diagnosed based on:  Your medical history.  A physical exam.  Imaging tests, such as an ultrasound or CT scan. These may  be needed if your health care provider suspects a hematoma in deeper tissues or body spaces.  Blood tests. These may be needed if your health care provider believes that the hematoma is caused by a medical condition. How is this treated? Treatment for this condition depends on the cause, size, and location of the hematoma. Treatment may include:  Doing nothing. The majority of hematomas do not need treatment as many of them go away on their own over time.  Surgery or close monitoring. This may be needed for large hematomas or hematomas that affect vital organs.  Medicines. Medicines may be given if there is an underlying medical cause for the hematoma. Follow these instructions at home: Managing pain, stiffness, and swelling   If directed, put ice on the affected area. ? Put ice in a plastic bag. ? Place a towel between your skin and the bag. ? Leave the ice on for 20 minutes, 2-3 times a day for the first couple of days.  If directed, apply heat to the affected area after applying ice for a couple of days. Use the heat source that your health care provider recommends, such as a moist heat pack or a heating pad. ? Place a towel between your skin and the heat source. ? Leave the heat on for 20-30 minutes. ? Remove the heat if your skin turns bright red. This is especially important if you are unable to feel pain, heat, or cold. You may have a greater   risk of getting burned.  Raise (elevate) the affected area above the level of your heart while you are sitting or lying down.  If told, wrap the affected area with an elastic bandage. The bandage applies pressure (compression) to the area, which may help to reduce swelling and promote healing. Do not wrap the bandage too tightly around the affected area.  If your hematoma is on a leg or foot (lower extremity) and is painful, your health care provider may recommend crutches. Use them as told by your health care provider. General  instructions  Take over-the-counter and prescription medicines only as told by your health care provider.  Keep all follow-up visits as told by your health care provider. This is important. Contact a health care provider if:  You have a fever.  The swelling or discoloration gets worse.  You develop more hematomas. Get help right away if:  Your pain is worse or your pain is not controlled with medicine.  Your skin over the hematoma breaks or starts bleeding.  Your hematoma is in your chest or abdomen and you have weakness, shortness of breath, or a change in consciousness.  You have a hematoma on your scalp that is caused by a fall or injury, and you also have: ? A headache that gets worse. ? Trouble speaking or understanding speech. ? Weakness. ? Change in alertness or consciousness. Summary  A hematoma is a collection of blood under the skin, in an organ, in a body space, in a joint space, or in other tissue.  This condition usually does not need treatment because many hematomas go away on their own over time.  Large hematomas, or those that may affect vital organs, may need surgical drainage or monitoring. If the hematoma is caused by a medical condition, medicines may be prescribed.  Get help right away if your hematoma breaks or starts to bleed, you have shortness of breath, or you have a headache or trouble speaking after a fall. This information is not intended to replace advice given to you by your health care provider. Make sure you discuss any questions you have with your health care provider. Document Revised: 03/10/2019 Document Reviewed: 03/19/2018 Elsevier Patient Education  2020 [employer]  

## 2020-10-19 NOTE — Assessment & Plan Note (Signed)
Acute, improving.  Discussed differentials including DVT, cellulitis, injury to varicose veins, or hematoma.  Unlikely DVT since pain was only to touch, however will obtain doppler ultrasound to rule out.  No chest pain or shortness of breath.  No s/s cellulitis on examination today.  Most likely, injury to varicose veins.  Discussed using ice to help with pain/swelling along with compression stockings.  Return to clinic if symptoms persist.

## 2020-10-19 NOTE — Progress Notes (Signed)
Subjective:    Patient ID: Beverly Mclaughlin, female    DOB: [birth year], 49 y.o.   MRN: [id number]  HPI: Beverly Mclaughlin is a 49 y.o. female presenting for leg pain.  Chief Complaint  Patient presents with  . Leg Pain    Began Thanksgiving Day, sore to touch right pain. Does work out not much leg work out for now. Took meloxicam for the pain yesterday   LEG PAIN Started Thanksgiving day in the shower, was tender to the touch and noticed a bruise was present.  Drives a lot- works in [place] and had been going back and forth to [place] to help care for her mother. Pain only with touch, not when walking or with activity.  Bruising, knot, and pain have all improved greatly.  Worried about a blood clot.  Does have varicose veins. Duration: weeks Pain: yes Severity: moderate  Quality:  Soreness to touch Location:  Right calf Bilateral:  no Onset: sudden Frequency: only with touch Time of day: at random Sudden unintentional leg jerking:   no Paresthesias:   no Decreased sensation:  no Weakness:   no Insomnia:   no Fatigue:   no  Chest pain: no Shortness of breath: no Alleviating factors:  Aggravating factors: Status: better Treatments attempted: Meloxicam   Mother had a blood clot when she was younger, thinks grandfather also had one.    Allergies  Allergen Reactions  . Prilosec [Omeprazole] Hives and Rash    Outpatient Encounter Medications as of 10/19/2020  Medication Sig  . carbamazepine (TEGRETOL) 200 MG tablet Take 1 tablet (200 mg total) by mouth 2 (two) times daily. Start 200mg  once daily for 1 week then increase to 200mg  twice daily  . meloxicam (MOBIC) 7.5 MG tablet Take 1 tablet (7.5 mg total) by mouth daily.  . Multiple Vitamins-Minerals (MULTIVITAMIN WITH MINERALS) tablet Take 1 tablet by mouth daily.  . [DISCONTINUED] gabapentin (NEURONTIN) 600 MG tablet Take 1 tablet (600 mg total) by mouth at bedtime.   No facility-administered encounter medications on  file as of 10/19/2020.    Patient Active Problem List   Diagnosis Date Noted  . Right calf pain 10/19/2020  . Trigeminal neuralgia 04/29/2017  . H/O total vaginal hysterectomy 11/27/2016  . Obesity 05/24/2014  . Routine general medical examination at a health care facility 05/24/2014  . Acid reflux 05/24/2014    Past Medical History:  Diagnosis Date  . Anemia   . Facial pain   . Trigeminal neuralgia 04/29/2017    Right V2   Relevant past medical, surgical, family and social history reviewed and updated as indicated. Interim medical history since our last visit reviewed.  Review of Systems  Constitutional: Negative.  Negative for fatigue and fever.  Respiratory: Negative.  Negative for chest tightness, shortness of breath and wheezing.   Cardiovascular: Negative.  Negative for chest pain and leg swelling.  Musculoskeletal: Positive for myalgias (right calf pain). Negative for arthralgias, gait problem and joint swelling.  Skin: Positive for color change (bruise to right calf - resolved now). Negative for wound.  Neurological: Negative.   Psychiatric/Behavioral: Negative.     Per HPI unless specifically indicated above     Objective:    BP 128/80   Pulse 100   Temp 98.4 F (36.9 C)   Ht 5\' 8"  (1.727 m)   Wt 273 lb 12.8 oz (124.2 kg)   LMP 11/15/2015   SpO2 98%   BMI 41.63 kg/m   Wt Readings from Last  3 Encounters:  10/19/20 273 lb 12.8 oz (124.2 kg)  12/07/19 273 lb (123.8 kg)  10/14/19 272 lb 9.6 oz (123.7 kg)    Physical Exam Vitals and nursing note reviewed.  Constitutional:      General: She is not in acute distress.    Appearance: Normal appearance. She is not toxic-appearing.  Cardiovascular:     Rate and Rhythm: Normal rate.  Pulmonary:     Effort: Pulmonary effort is normal. No respiratory distress.  Musculoskeletal:        General: Tenderness (with deep palpation to right upper shin medially) present. Normal range of motion.     Right lower leg:  Normal. No edema.     Left lower leg: No edema.     Right ankle: Normal pulse.     Left ankle: Normal pulse.     Right foot: Normal pulse.     Left foot: Normal pulse.     Comments: Varicose veins palpated bilaterally  Skin:    General: Skin is warm and dry.     Capillary Refill: Capillary refill takes less than 2 seconds.     Coloration: Skin is not jaundiced or pale.     Findings: No bruising, erythema, rash or wound.  Neurological:     Mental Status: She is alert and oriented to person, place, and time.  Psychiatric:        Mood and Affect: Mood normal.        Behavior: Behavior normal.        Thought Content: Thought content normal.        Judgment: Judgment normal.       Assessment & Plan:   Problem List Items Addressed This Visit      Other   Right calf pain - Primary    Acute, improving.  Discussed differentials including DVT, cellulitis, injury to varicose veins, or hematoma.  Unlikely DVT since pain was only to touch, however will obtain doppler ultrasound to rule out.  No chest pain or shortness of breath.  No s/s cellulitis on examination today.  Most likely, injury to varicose veins.  Discussed using ice to help with pain/swelling along with compression stockings.  Return to clinic if symptoms persist.          Follow up plan: Return for as scheduled with PCP.

## 2020-11-07 ENCOUNTER — Other Ambulatory Visit: Payer: Self-pay

## 2020-11-07 ENCOUNTER — Telehealth: Payer: BC Managed Care – PPO | Admitting: Family Medicine

## 2020-11-07 DIAGNOSIS — F4321 Adjustment disorder with depressed mood: Secondary | ICD-10-CM | POA: Diagnosis not present

## 2020-11-07 DIAGNOSIS — F418 Other specified anxiety disorders: Secondary | ICD-10-CM

## 2020-11-07 MED ORDER — LORAZEPAM 0.5 MG PO TABS: 0.5000 mg | ORAL_TABLET | Freq: Two times a day (BID) | ORAL | 1 refills | Status: DC | PRN

## 2020-11-07 NOTE — Progress Notes (Unsigned)
Virtual Visit via Video Note  I connected with Beverly Mclaughlin on 11/07/20 at 1:56PM  by a video enabled telemedicine application and verified that I am speaking with the correct person using two identifiers.  Location: Patient: at work/ secure office  Provider: In office    I discussed the limitations of evaluation and management by telemedicine and the availability of in person appointments. The patient expressed understanding and agreed to proceed.  History of Present Illness: This is a setting of COVID-19 pandemic.  Patient called in secondary to increasing stress of the past few weeks.  Her mother passed away on 2023/10/24.  She admits that she has been under significant stress and not sleeping well since then.  Her and her boys were thinking about their mother and she began to have a panic attack earlier this morning.  EMS was called.  EKG was normal her blood pressure is 133/88 her heart rate was up into the 140s but came down fairly quickly down to about 100.  Her oxygen saturations were normal.  She felt fine before they started conversing about her mother.  She admits that she has been on edge recently since her mother's death but in general she is able to do her regular activities and work without any difficulty.  She did have a panic attack in the past with the last one was about 3 to 4 years ago.  She is reaching out to get some medication to help calm her nerves and help her rest a little bit easier during this time.  She is also interested in grief therapy.   Observations/Objective: NAD, alert and oriented x 3 RESP- normal WOB Psych tearful at times, not depressed or anxious appearing, normal speech, normal thought process   Assessment and Plan: Situauonal anxiety, grief reaction-situational anxiety and suffered a panic attack earlier this morning in setting of recent passing of her mother and some stressors at home.  We discussed treatment and decided to give her lorazepam 0.5 mg  as needed we will follow-up in a few weeks to see how much she is actually required the medication.  We also discussed psychotherapy and she has been referred   Follow Up Instructions:    I discussed the assessment and treatment plan with the patient. The patient was provided an opportunity to ask questions and all were answered. The patient agreed with the plan and demonstrated an understanding of the instructions.   The patient was advised to call back or seek an in-person evaluation if the symptoms worsen or if the condition fails to improve as anticipated.  I provided 24 minutes of non-face-to-face time during this encounter. End Time 2:20pm  Milinda Antis, MD

## 2020-11-08 ENCOUNTER — Encounter: Payer: Self-pay | Admitting: Family Medicine

## 2020-12-01 ENCOUNTER — Encounter: Payer: Self-pay | Admitting: *Deleted

## 2020-12-05 ENCOUNTER — Ambulatory Visit (INDEPENDENT_AMBULATORY_CARE_PROVIDER_SITE_OTHER): Payer: BC Managed Care – PPO | Admitting: Psychologist

## 2020-12-05 DIAGNOSIS — F4322 Adjustment disorder with anxiety: Secondary | ICD-10-CM | POA: Diagnosis not present

## 2020-12-05 DIAGNOSIS — Z634 Disappearance and death of family member: Secondary | ICD-10-CM

## 2020-12-07 NOTE — Progress Notes (Signed)
Subjective:    Patient ID: Beverly Mclaughlin, female    DOB: December 10, 1970, 50 y.o.   MRN: [id number]  Patient presents for Annual Exam (Is fasting)  Pt here for CPE, recently seen due to some situational anxiety prescribed ativan PRN But she is taking at bedtime to help her res t She does have appt with Dr. Bosie Clos psychologist She has some moments when she feels overwhelmed thinking about mom.   History of trigeminal neuralgia no longer on tegretol, no issues with nerve pain  Immunizations- Flu shot / COVID  Mammogram Due end of Feb  Cologuard UTD  Due for fasting labs today   Follows with dentist and eye doctor  S/P Hysterectomy No PAP   Obesity weight down 3lbs since December  She is still having soreness in the right lower side, points more toward hip normal bowel movements,no urinary symptoms She will feel it if she moves a certain way of if she lays on that side, no back pain, taking Ibuprofen helps She has had 2 falls, last on Monday of this week - slipped on ice and pain has been a little worse   Review Of Systems:  GEN- denies fatigue, fever, weight loss,weakness, recent illness HEENT- denies eye drainage, change in vision, nasal discharge, CVS- denies chest pain, palpitations RESP- denies SOB, cough, wheeze ABD- denies N/V, change in stools, abd pain GU- denies dysuria, hematuria, dribbling, incontinence MSK- denies joint pain, muscle aches, injury Neuro- denies headache, dizziness, syncope, seizure activity       Objective:    BP 114/72   Pulse 80   Temp 98.3 F (36.8 C) (Temporal)   Resp 14   Ht 5\' 8"  (1.727 m)   Wt 270 lb (122.5 kg)   LMP 11/15/2015   SpO2 99%   BMI 41.05 kg/m  GEN- NAD, alert and oriented x3 HEENT- PERRL, EOMI, non injected sclera, pink conjunctiva, MMM, oropharynx clear Neck- Supple, no thyromegaly CVS- RRR, no murmur RESP-CTAB ABD-NABS,soft,NT,ND MSK- lumbar spine NT, good ROM, TTP over right hip, good ROM, pain with  Rotation, neg SLR NEURO-CNII-XII in tact no focal deficit Psych normal affect and mood EXT- No edema Pulses- Radial, DP- 2+   FALL/DEPRESSION/AUDIT C Neg     Assessment & Plan:      Problem List Items Addressed This Visit      Unprioritized   Obesity   Relevant Orders   Lipid panel   Routine general medical examination at a health care facility - Primary    CPE done, fasting labs obtained Hep C screen Pt to schedule mammogram  Situational anxiety with mother passing, she has established with a therapist Continue prn ativan at bedtime to help with sleep  She has some support from family   Right hip/side pain- consistent with injury to hip, send to ortho for imaging, possible bursitis as well Prn ibuprofen has helped Pain is not in abdominal cavity      Relevant Orders   CBC with Differential/Platelet   Comprehensive metabolic panel   Lipid panel    Other Visit Diagnoses    Situational anxiety       Relevant Medications   LORazepam (ATIVAN) 0.5 MG tablet   Encounter for screening mammogram for malignant neoplasm of breast       Relevant Orders   MM 3D SCREEN BREAST BILATERAL   Right hip pain       Relevant Orders   Ambulatory referral to Orthopedic Surgery   Need for hepatitis C  screening test       Relevant Orders   Hepatitis C antibody      Note: This dictation was prepared with Dragon dictation along with smaller phrase technology. Any transcriptional errors that result from this process are unintentional.

## 2020-12-08 ENCOUNTER — Other Ambulatory Visit: Payer: Self-pay

## 2020-12-08 ENCOUNTER — Ambulatory Visit (INDEPENDENT_AMBULATORY_CARE_PROVIDER_SITE_OTHER): Payer: BC Managed Care – PPO | Admitting: Family Medicine

## 2020-12-08 ENCOUNTER — Encounter: Payer: Self-pay | Admitting: Family Medicine

## 2020-12-08 VITALS — BP 114/72 | HR 80 | Temp 98.3°F | Resp 14 | Ht 68.0 in | Wt 270.0 lb

## 2020-12-08 DIAGNOSIS — F418 Other specified anxiety disorders: Secondary | ICD-10-CM

## 2020-12-08 DIAGNOSIS — E66813 Obesity, class 3: Secondary | ICD-10-CM

## 2020-12-08 DIAGNOSIS — Z6841 Body Mass Index (BMI) 40.0 and over, adult: Secondary | ICD-10-CM | POA: Diagnosis not present

## 2020-12-08 DIAGNOSIS — Z0001 Encounter for general adult medical examination with abnormal findings: Secondary | ICD-10-CM

## 2020-12-08 DIAGNOSIS — Z0000 Encounter for general adult medical examination without abnormal findings: Secondary | ICD-10-CM

## 2020-12-08 DIAGNOSIS — Z1231 Encounter for screening mammogram for malignant neoplasm of breast: Secondary | ICD-10-CM

## 2020-12-08 DIAGNOSIS — Z1159 Encounter for screening for other viral diseases: Secondary | ICD-10-CM

## 2020-12-08 DIAGNOSIS — M25551 Pain in right hip: Secondary | ICD-10-CM

## 2020-12-08 MED ORDER — LORAZEPAM 0.5 MG PO TABS: 0.5000 mg | ORAL_TABLET | Freq: Two times a day (BID) | ORAL | 1 refills | Status: DC | PRN

## 2020-12-08 NOTE — Assessment & Plan Note (Signed)
CPE done, fasting labs obtained Hep C screen Pt to schedule mammogram  Situational anxiety with mother passing, she has established with a therapist Continue prn ativan at bedtime to help with sleep  She has some support from family   Right hip/side pain- consistent with injury to hip, send to ortho for imaging, possible bursitis as well Prn ibuprofen has helped Pain is not in abdominal cavity

## 2020-12-08 NOTE — Patient Instructions (Addendum)
Change PCP to  [facility]  Referral to ortho Schedule your mammogram

## 2020-12-10 ENCOUNTER — Encounter: Payer: Self-pay | Admitting: Family Medicine

## 2020-12-11 LAB — COMPREHENSIVE METABOLIC PANEL
AG Ratio: 1.3 (calc) (ref 1.0–2.5)
ALT: 9 U/L (ref 6–29)
AST: 14 U/L (ref 10–35)
Albumin: 3.8 g/dL (ref 3.6–5.1)
Alkaline phosphatase (APISO): 46 U/L (ref 37–153)
BUN: 12 mg/dL (ref 7–25)
CO2: 25 mmol/L (ref 20–32)
Calcium: 9.3 mg/dL (ref 8.6–10.4)
Chloride: 101 mmol/L (ref 98–110)
Creat: 0.82 mg/dL (ref 0.50–1.05)
Globulin: 2.9 g/dL (calc) (ref 1.9–3.7)
Glucose, Bld: 84 mg/dL (ref 65–99)
Potassium: 4.5 mmol/L (ref 3.5–5.3)
Sodium: 138 mmol/L (ref 135–146)
Total Bilirubin: 0.4 mg/dL (ref 0.2–1.2)
Total Protein: 6.7 g/dL (ref 6.1–8.1)

## 2020-12-11 LAB — CBC WITH DIFFERENTIAL/PLATELET
Absolute Monocytes: 330 cells/uL (ref 200–950)
Basophils Absolute: 60 cells/uL (ref 0–200)
Basophils Relative: 1.2 %
Eosinophils Absolute: 10 cells/uL — ABNORMAL LOW (ref 15–500)
Eosinophils Relative: 0.2 %
HCT: 36.3 % (ref 35.0–45.0)
Hemoglobin: 12.5 g/dL (ref 11.7–15.5)
Lymphs Abs: 1160 cells/uL (ref 850–3900)
MCH: 32.3 pg (ref 27.0–33.0)
MCHC: 34.4 g/dL (ref 32.0–36.0)
MCV: 93.8 fL (ref 80.0–100.0)
MPV: 10 fL (ref 7.5–12.5)
Monocytes Relative: 6.6 %
Neutro Abs: 3440 cells/uL (ref 1500–7800)
Neutrophils Relative %: 68.8 %
Platelets: 252 10*3/uL (ref 140–400)
RBC: 3.87 10*6/uL (ref 3.80–5.10)
RDW: 11.6 % (ref 11.0–15.0)
Total Lymphocyte: 23.2 %
WBC: 5 10*3/uL (ref 3.8–10.8)

## 2020-12-11 LAB — HEPATITIS C ANTIBODY
Hepatitis C Ab: NONREACTIVE
SIGNAL TO CUT-OFF: 0.01 (ref <1.00)

## 2020-12-11 LAB — LIPID PANEL
Cholesterol: 153 mg/dL (ref <200)
HDL: 65 mg/dL (ref >=50)
LDL Cholesterol (Calc): 73 mg/dL (calc)
Non-HDL Cholesterol (Calc): 88 mg/dL (calc) (ref <130)
Total CHOL/HDL Ratio: 2.4 (calc) (ref <5.0)
Triglycerides: 69 mg/dL (ref <150)

## 2020-12-18 ENCOUNTER — Ambulatory Visit (INDEPENDENT_AMBULATORY_CARE_PROVIDER_SITE_OTHER): Payer: BC Managed Care – PPO | Admitting: Psychologist

## 2020-12-18 DIAGNOSIS — Z634 Disappearance and death of family member: Secondary | ICD-10-CM | POA: Diagnosis not present

## 2020-12-18 DIAGNOSIS — F4322 Adjustment disorder with anxiety: Secondary | ICD-10-CM | POA: Diagnosis not present

## 2020-12-25 ENCOUNTER — Other Ambulatory Visit: Payer: Self-pay

## 2020-12-25 ENCOUNTER — Ambulatory Visit (INDEPENDENT_AMBULATORY_CARE_PROVIDER_SITE_OTHER): Payer: BC Managed Care – PPO | Admitting: Orthopedic Surgery

## 2020-12-25 ENCOUNTER — Ambulatory Visit: Payer: BC Managed Care – PPO

## 2020-12-25 ENCOUNTER — Other Ambulatory Visit: Payer: Self-pay | Admitting: Orthopedic Surgery

## 2020-12-25 ENCOUNTER — Encounter: Payer: Self-pay | Admitting: Orthopedic Surgery

## 2020-12-25 VITALS — BP 135/83 | HR 74 | Ht 68.0 in | Wt 272.2 lb

## 2020-12-25 DIAGNOSIS — M25551 Pain in right hip: Secondary | ICD-10-CM

## 2020-12-25 DIAGNOSIS — Z6841 Body Mass Index (BMI) 40.0 and over, adult: Secondary | ICD-10-CM | POA: Diagnosis not present

## 2020-12-25 DIAGNOSIS — M47817 Spondylosis without myelopathy or radiculopathy, lumbosacral region: Secondary | ICD-10-CM

## 2020-12-25 DIAGNOSIS — M545 Low back pain, unspecified: Secondary | ICD-10-CM

## 2020-12-25 MED ORDER — IBUPROFEN 800 MG PO TABS: 800.0000 mg | ORAL_TABLET | Freq: Three times a day (TID) | ORAL | 1 refills | Status: DC | PRN

## 2020-12-25 MED ORDER — METHOCARBAMOL 500 MG PO TABS: 500.0000 mg | ORAL_TABLET | Freq: Every evening | ORAL | 1 refills | Status: DC

## 2020-12-25 NOTE — Patient Instructions (Signed)
Acute Back Pain, Adult Acute back pain is sudden and usually short-lived. It is often caused by an injury to the muscles and tissues in the back. The injury may result from:  A muscle or ligament getting overstretched or torn (strained). Ligaments are tissues that connect bones to each other. Lifting something improperly can cause a back strain.  Wear and tear (degeneration) of the spinal disks. Spinal disks are circular tissue that provide cushioning between the bones of the spine (vertebrae).  Twisting motions, such as while playing sports or doing yard work.  A hit to the back.  Arthritis. You may have a physical exam, lab tests, and imaging tests to find the cause of your pain. Acute back pain usually goes away with rest and home care. Follow these instructions at home: Managing pain, stiffness, and swelling  Treatment may include medicines for pain and inflammation that are taken by mouth or applied to the skin, prescription pain medicine, or muscle relaxants. Take over-the-counter and prescription medicines only as told by your health care provider.  Your health care provider may recommend applying ice during the first 24-48 hours after your pain starts. To do this: ? Put ice in a plastic bag. ? Place a towel between your skin and the bag. ? Leave the ice on for 20 minutes, 2-3 times a day.  If directed, apply heat to the affected area as often as told by your health care provider. Use the heat source that your health care provider recommends, such as a moist heat pack or a heating pad. ? Place a towel between your skin and the heat source. ? Leave the heat on for 20-30 minutes. ? Remove the heat if your skin turns bright red. This is especially important if you are unable to feel pain, heat, or cold. You have a greater risk of getting burned. Activity  Do not stay in bed. Staying in bed for more than 1-2 days can delay your recovery.  Sit up and stand up straight. Avoid leaning  forward when you sit or hunching over when you stand. ? If you work at a desk, sit close to it so you do not need to lean over. Keep your chin tucked in. Keep your neck drawn back, and keep your elbows bent at a 90-degree angle (right angle). ? Sit high and close to the steering wheel when you drive. Add lower back (lumbar) support to your car seat, if needed.  Take short walks on even surfaces as soon as you are able. Try to increase the length of time you walk each day.  Do not sit, drive, or stand in one place for more than 30 minutes at a time. Sitting or standing for long periods of time can put stress on your back.  Do not drive or use heavy machinery while taking prescription pain medicine.  Use proper lifting techniques. When you bend and lift, use positions that put less stress on your back: ? Bend your knees. ? Keep the load close to your body. ? Avoid twisting.  Exercise regularly as told by your health care provider. Exercising helps your back heal faster and helps prevent back injuries by keeping muscles strong and flexible.  Work with a physical therapist to make a safe exercise program, as recommended by your health care provider. Do any exercises as told by your physical therapist.   Lifestyle  Maintain a healthy weight. Extra weight puts stress on your back and makes it difficult to have   good posture.  Avoid activities or situations that make you feel anxious or stressed. Stress and anxiety increase muscle tension and can make back pain worse. Learn ways to manage anxiety and stress, such as through exercise. General instructions  Sleep on a firm mattress in a comfortable position. Try lying on your side with your knees slightly bent. If you lie on your back, put a pillow under your knees.  Follow your treatment plan as told by your health care provider. This may include: ? Cognitive or behavioral therapy. ? Acupuncture or massage therapy. ? Meditation or yoga. Contact  a health care provider if:  You have pain that is not relieved with rest or medicine.  You have increasing pain going down into your legs or buttocks.  Your pain does not improve after 2 weeks.  You have pain at night.  You lose weight without trying.  You have a fever or chills. Get help right away if:  You develop new bowel or bladder control problems.  You have unusual weakness or numbness in your arms or legs.  You develop nausea or vomiting.  You develop abdominal pain.  You feel faint. Summary  Acute back pain is sudden and usually short-lived.  Use proper lifting techniques. When you bend and lift, use positions that put less stress on your back.  Take over-the-counter and prescription medicines and apply heat or ice as directed by your health care provider. This information is not intended to replace advice given to you by your health care provider. Make sure you discuss any questions you have with your health care provider. Document Revised: 07/07/2020 Document Reviewed: 07/07/2020 Elsevier Patient Education  2021 [employer]  

## 2020-12-25 NOTE — Progress Notes (Addendum)
NEW PROBLEM//OFFICE VISIT  Summary assessment and plan: 50 year old with facet arthritis back pain reactive scoliosis recommend physical therapy ibuprofen and Robaxin  Chief Complaint  Patient presents with  . Hip Pain    R/hurting off and on for about two months. It was sharp pain, now its more soreness    50 year old female started having lower back and hip pain not 1 to 2 months ago took some ibuprofen on an as-needed basis seems to help but consistent with soreness in the right side of her lower back and buttock no groin is occasional catch when walking some stiffness after getting up in the morning   Review of Systems  Constitutional: Negative for chills, fever, malaise/fatigue and weight loss.  Gastrointestinal: Negative for constipation.       Denies loss bowel control   Genitourinary:       Denies urinary retention or los of bladder control      Past Medical History:  Diagnosis Date  . Anemia   . Facial pain   . Trigeminal neuralgia 04/29/2017    Right V2    Past Surgical History:  Procedure Laterality Date  . ABDOMINAL HYSTERECTOMY  Jan 2017   -[facility]   . TUBAL LIGATION      Family History  Problem Relation Age of Onset  . Heart disease Mother   . Diabetes Mother   . Multiple myeloma Mother   . Kidney disease Mother   . Diabetes Father   . Kidney disease Father   . Hypertension Sister   . Diabetes Sister   . Cancer Maternal Grandmother   . Kidney disease Maternal Grandmother   . Diabetes Paternal Grandfather   . Kidney disease Maternal Uncle    Social History   Tobacco Use  . Smoking status: Never Smoker  . Smokeless tobacco: Never Used  Vaping Use  . Vaping Use: Never used  Substance Use Topics  . Alcohol use: Yes    Alcohol/week: 2.0 standard drinks    Types: 1 Glasses of wine, 1 Cans of beer per week    Comment: occasionally  . Drug use: No    Allergies  Allergen Reactions  . Prilosec [Omeprazole] Hives and Rash    Current  Meds  Medication Sig  . ibuprofen (ADVIL) 800 MG tablet Take 1 tablet (800 mg total) by mouth every 8 (eight) hours as needed.  Marland Kitchen LORazepam (ATIVAN) 0.5 MG tablet Take 1 tablet (0.5 mg total) by mouth 2 (two) times daily as needed for anxiety.  . methocarbamol (ROBAXIN) 500 MG tablet Take 1 tablet (500 mg total) by mouth at bedtime.  . Multiple Vitamins-Minerals (MULTIVITAMIN WITH MINERALS) tablet Take 1 tablet by mouth daily.    BP 135/83   Pulse 74   Ht $R'5\' 8"'wJ$  (1.727 m)   Wt 272 lb 4 oz (123.5 kg)   LMP 11/15/2015   BMI 41.40 kg/m  The patient meets the AMA guidelines for Morbid (severe) obesity with a BMI > 40.0 and I have recommended weight loss.  Physical Exam Constitutional:      General: She is not in acute distress.    Appearance: She is well-developed.     Comments: Well developed, well nourished Normal grooming and hygiene     Cardiovascular:     Comments: No peripheral edema Musculoskeletal:     Comments: Tenderness in the right lower back and a midline L4-5 and S1  Normal leg lengths normal hip flexion normal hip range of motion with no pain  or tenderness  Skin:    General: Skin is warm and dry.  Neurological:     Mental Status: She is alert and oriented to person, place, and time.     Sensory: No sensory deficit.     Coordination: Coordination normal.     Gait: Gait normal.     Deep Tendon Reflexes: Reflexes are normal and symmetric.  Psychiatric:        Mood and Affect: Mood normal.        Behavior: Behavior normal.        Thought Content: Thought content normal.        Judgment: Judgment normal.     Comments: Affect normal          MEDICAL DECISION MAKING  A.  Encounter Diagnoses  Name Primary?  . Pain in right hip   . Right-sided low back pain without sciatica, unspecified chronicity   . Facet arthritis of lumbosacral region Yes  . Body mass index 40.0-44.9, adult ([place])   . Morbid obesity ([place])     B. DATA  ANALYSED:   IMAGING: Interpretation of images: Our images in the office show facet arthritis L3-S1 increased lordosis reactive scoliosis left  Orders: Physical therapy  Outside records reviewed: None   C. MANAGEMENT   As above  Meds ordered this encounter  Medications  . ibuprofen (ADVIL) 800 MG tablet    Sig: Take 1 tablet (800 mg total) by mouth every 8 (eight) hours as needed.    Dispense:  90 tablet    Refill:  1  . methocarbamol (ROBAXIN) 500 MG tablet    Sig: Take 1 tablet (500 mg total) by mouth at bedtime.    Dispense:  30 tablet    Refill:  1     Meds ordered this encounter  Medications  . ibuprofen (ADVIL) 800 MG tablet    Sig: Take 1 tablet (800 mg total) by mouth every 8 (eight) hours as needed.    Dispense:  90 tablet    Refill:  1  . methocarbamol (ROBAXIN) 500 MG tablet    Sig: Take 1 tablet (500 mg total) by mouth at bedtime.    Dispense:  30 tablet    Refill:  1      Arther Abbott, MD  12/25/2020 9:16 AM

## 2020-12-29 ENCOUNTER — Ambulatory Visit: Payer: BC Managed Care – PPO | Admitting: Psychologist

## 2021-01-01 ENCOUNTER — Ambulatory Visit (HOSPITAL_COMMUNITY)
Admission: RE | Admit: 2021-01-01 | Discharge: 2021-01-01 | Disposition: A | Discharge status: Home / Self Care | Payer: BC Managed Care – PPO | Source: Ambulatory Visit | Attending: Family Medicine | Admitting: Family Medicine

## 2021-01-01 ENCOUNTER — Other Ambulatory Visit: Payer: Self-pay

## 2021-01-01 DIAGNOSIS — Z1231 Encounter for screening mammogram for malignant neoplasm of breast: Secondary | ICD-10-CM | POA: Insufficient documentation

## 2021-01-09 ENCOUNTER — Other Ambulatory Visit: Payer: Self-pay

## 2021-01-09 ENCOUNTER — Ambulatory Visit (HOSPITAL_COMMUNITY): Payer: BC Managed Care – PPO | Admitting: Physical Therapy

## 2021-01-09 ENCOUNTER — Encounter (HOSPITAL_COMMUNITY): Payer: Self-pay

## 2021-01-31 ENCOUNTER — Ambulatory Visit (HOSPITAL_COMMUNITY): Payer: BC Managed Care – PPO

## 2021-04-25 ENCOUNTER — Other Ambulatory Visit: Payer: Self-pay | Admitting: *Deleted

## 2021-04-25 NOTE — Telephone Encounter (Signed)
Received fax requesting refill on Ativan.   Ok to refill??  Last office visit/ refill 12/08/2020, #1 refill.

## 2022-04-12 ENCOUNTER — Emergency Department (HOSPITAL_BASED_OUTPATIENT_CLINIC_OR_DEPARTMENT_OTHER)
Admission: EM | Admit: 2022-04-12 | Discharge: 2022-04-12 | Discharge status: Against Medical Advice | Payer: Managed Care, Other (non HMO) | Attending: Emergency Medicine | Admitting: Emergency Medicine

## 2022-04-12 ENCOUNTER — Other Ambulatory Visit: Payer: Self-pay

## 2022-04-12 ENCOUNTER — Encounter (HOSPITAL_BASED_OUTPATIENT_CLINIC_OR_DEPARTMENT_OTHER): Payer: Self-pay | Admitting: Emergency Medicine

## 2022-04-12 DIAGNOSIS — M549 Dorsalgia, unspecified: Secondary | ICD-10-CM | POA: Insufficient documentation

## 2022-04-12 DIAGNOSIS — Z5321 Procedure and treatment not carried out due to patient leaving prior to being seen by health care provider: Secondary | ICD-10-CM | POA: Diagnosis not present

## 2022-04-12 LAB — URINALYSIS, ROUTINE W REFLEX MICROSCOPIC
Bilirubin Urine: NEGATIVE
Glucose, UA: NEGATIVE mg/dL
Hgb urine dipstick: NEGATIVE
Ketones, ur: NEGATIVE mg/dL
Leukocytes,Ua: NEGATIVE
Nitrite: NEGATIVE
Protein, ur: NEGATIVE mg/dL
Specific Gravity, Urine: 1.014 (ref 1.005–1.030)
pH: 6 (ref 5.0–8.0)

## 2022-04-12 LAB — PREGNANCY, URINE: Preg Test, Ur: NEGATIVE

## 2022-04-12 NOTE — ED Triage Notes (Signed)
Back pain toward left side. Started 3 hours pta. Did not take anything for pain. No precipitating event

## 2022-04-12 NOTE — ED Notes (Signed)
Pt was seen walking out by staff. Stating that she was leaving due to long wait.

## 2022-04-29 ENCOUNTER — Telehealth: Payer: Self-pay | Admitting: Family Medicine

## 2022-04-29 NOTE — Telephone Encounter (Signed)
Pt scheduled an appt with Amy,NP due to Trigeminal Neuralgia has been worsening for a week.

## 2022-04-29 NOTE — Telephone Encounter (Signed)
Noted. Reviewed chart. Pt last seen 10/14/19 for TN. Appt scheduled for 05/07/22 at 2pm with AL,NP.

## 2022-05-02 ENCOUNTER — Telehealth: Payer: Self-pay | Admitting: Radiology

## 2022-05-02 ENCOUNTER — Encounter: Payer: Self-pay | Admitting: Orthopedic Surgery

## 2022-05-02 ENCOUNTER — Ambulatory Visit (INDEPENDENT_AMBULATORY_CARE_PROVIDER_SITE_OTHER): Payer: Managed Care, Other (non HMO) | Admitting: Orthopedic Surgery

## 2022-05-02 VITALS — Ht 68.0 in | Wt 286.0 lb

## 2022-05-02 DIAGNOSIS — G8929 Other chronic pain: Secondary | ICD-10-CM

## 2022-05-02 DIAGNOSIS — M545 Low back pain, unspecified: Secondary | ICD-10-CM

## 2022-05-02 MED ORDER — IBUPROFEN 800 MG PO TABS: 800.0000 mg | ORAL_TABLET | Freq: Three times a day (TID) | ORAL | 1 refills | Status: DC | PRN

## 2022-05-02 MED ORDER — METHOCARBAMOL 500 MG PO TABS: 500.0000 mg | ORAL_TABLET | Freq: Three times a day (TID) | ORAL | 1 refills | Status: DC

## 2022-05-02 NOTE — Telephone Encounter (Signed)
To determine dose for antiinflammatories

## 2022-05-02 NOTE — Progress Notes (Signed)
  Chief Complaint  Patient presents with   Back Pain    LBP getting worse    Knee Pain    Lt knee pain    The patient presents with lower back pain primarily on the left side with no radiation to the left leg  She went to the emergency room for evaluation had a urinalysis which was negative  She did try some Tylenol arthritis without much success  I saw her in the past recommended physical therapy but she never went for that.  She says the back pain is now a little bit worse  Past Medical History:  Diagnosis Date   Anemia    Facial pain    Trigeminal neuralgia 04/29/2017    Right V2    Ht 5\' 8"  (1.727 m)   Wt 286 lb (129.7 kg)   LMP 11/15/2015   BMI 43.49 kg/m   Physical Exam Constitutional:      Appearance: Normal appearance.  HENT:     Head: Normocephalic and atraumatic.  Eyes:     Extraocular Movements: Extraocular movements intact.     Conjunctiva/sclera: Conjunctivae normal.     Pupils: Pupils are equal, round, and reactive to light.  Cardiovascular:     Pulses: Normal pulses.  Musculoskeletal:     Lumbar back: Spasms and tenderness present. No swelling, deformity, lacerations or bony tenderness. Decreased range of motion. Negative right straight leg raise test and negative left straight leg raise test. No scoliosis.  Skin:    General: Skin is warm and dry.     Capillary Refill: Capillary refill takes less than 2 seconds.  Neurological:     General: No focal deficit present.     Mental Status: She is alert and oriented to person, place, and time.     Cranial Nerves: No cranial nerve deficit.     Sensory: No sensory deficit.     Motor: No weakness.     Coordination: Coordination normal.     Gait: Gait normal.     Deep Tendon Reflexes: Reflexes normal.  Psychiatric:        Mood and Affect: Mood normal.        Behavior: Behavior normal.        Thought Content: Thought content normal.        Judgment: Judgment normal.    Encounter Diagnosis  Name Primary?    Chronic left-sided low back pain without sciatica Yes   REC NON OP TREATMENT   PT     Meds ordered this encounter  Medications   methocarbamol (ROBAXIN) 500 MG tablet    Sig: Take 1 tablet (500 mg total) by mouth 3 (three) times daily.    Dispense:  60 tablet    Refill:  1   ibuprofen (ADVIL) 800 MG tablet    Sig: Take 1 tablet (800 mg total) by mouth every 8 (eight) hours as needed.    Dispense:  90 tablet    Refill:  1    6 weeks fu   (CHRONIC PROBLEM WORSE WITH MEDS  ORDERED)

## 2022-05-02 NOTE — Telephone Encounter (Signed)
Patient wants to know why you asked about if she has kidney problems I told her I will find out and send her a mychart message

## 2022-05-02 NOTE — Patient Instructions (Signed)
Physical therapy has been ordered for you at [facility]. Please call to schedule, [phone] is the phone number to call.

## 2022-05-02 NOTE — Telephone Encounter (Signed)
Patient advised.

## 2022-05-05 ENCOUNTER — Other Ambulatory Visit: Payer: Self-pay

## 2022-05-05 ENCOUNTER — Emergency Department (HOSPITAL_COMMUNITY)
Admission: EM | Admit: 2022-05-05 | Discharge: 2022-05-05 | Disposition: A | Discharge status: Home / Self Care | Payer: Managed Care, Other (non HMO) | Attending: Emergency Medicine | Admitting: Emergency Medicine

## 2022-05-05 ENCOUNTER — Encounter (HOSPITAL_COMMUNITY): Payer: Self-pay | Admitting: *Deleted

## 2022-05-05 ENCOUNTER — Emergency Department (HOSPITAL_COMMUNITY): Payer: Managed Care, Other (non HMO)

## 2022-05-05 DIAGNOSIS — M25562 Pain in left knee: Secondary | ICD-10-CM

## 2022-05-05 MED ORDER — HYDROCODONE-ACETAMINOPHEN 5-325 MG PO TABS
2.0000 | ORAL_TABLET | Freq: Once | ORAL | Status: AC
  Administered 2022-05-05: 2 via ORAL
  Filled 2022-05-05: qty 2

## 2022-05-05 NOTE — ED Provider Notes (Signed)
[facility] EMERGENCY DEPARTMENT Provider Note   CSN: [id number] Arrival date & time: 05/05/22  1924     History Chief Complaint  Patient presents with   Knee Pain    Beverly Mclaughlin is a 51 y.o. female patient who presents to the emergency department for further evaluation of left knee pain that has been ongoing for some time but worse over the last few days.  Patient actually saw her orthopedist on Thursday for her back and was discussing her symptoms in the left knee.  Per the patient, they did not dive into deeper on her symptoms.  Patient states that she has a pulling sensation to the left medial knee and is having difficulty bending the knee and walking secondary to pain.  She took 800 mg ibuprofen at 4 PM today.  She denies any injury or trauma to the knee.   Knee Pain      Home Medications Prior to Admission medications   Medication Sig Start Date End Date Taking? Authorizing Provider  ibuprofen (ADVIL) 800 MG tablet Take 1 tablet (800 mg total) by mouth every 8 (eight) hours as needed. 05/02/22   Vickki Hearing, MD  methocarbamol (ROBAXIN) 500 MG tablet Take 1 tablet (500 mg total) by mouth 3 (three) times daily. 05/02/22   Vickki Hearing, MD      Allergies    Prilosec [omeprazole]    Review of Systems   Review of Systems  All other systems reviewed and are negative.   Physical Exam Updated Vital Signs BP 132/89 (BP Location: Left Arm)   Pulse 77   Temp 98.4 F (36.9 C) (Oral)   Resp 18   Ht 5\' 8"  (1.727 m)   Wt 129.7 kg   LMP 11/15/2015   SpO2 100%   BMI 43.49 kg/m  Physical Exam Vitals and nursing note reviewed.  Constitutional:      Appearance: Normal appearance.  HENT:     Head: Normocephalic and atraumatic.  Eyes:     General:        Right eye: No discharge.        Left eye: No discharge.     Conjunctiva/sclera: Conjunctivae normal.  Pulmonary:     Effort: Pulmonary effort is normal.  Musculoskeletal:     Comments: No significant  swelling over the left knee.  There is tenderness to the posterior knee.  Negative anterior drawer.  Negative valgus and varus stress testing.  2+ dorsalis pedis pulse felt bilaterally.  Skin:    General: Skin is warm and dry.     Findings: No rash.  Neurological:     General: No focal deficit present.     Mental Status: She is alert.  Psychiatric:        Mood and Affect: Mood normal.        Behavior: Behavior normal.     ED Results / Procedures / Treatments   Labs (all labs ordered are listed, but only abnormal results are displayed) Labs Reviewed - No data to display  EKG None  Radiology DG Knee Complete 4 Views Left  Result Date: 05/05/2022 CLINICAL DATA:  Knee pain. EXAM: LEFT KNEE - COMPLETE 4+ VIEW COMPARISON:  None Available. FINDINGS: There is no evidence for acute fracture or dislocation. There is mild patellofemoral compartment joint space narrowing. There is lateral and patellofemoral compartment osteophyte formation. There is no evidence for joint effusion or other soft tissue abnormality. IMPRESSION: 1. No acute bony abnormality. 2. Mild degenerative changes. Electronically  Signed   By: Darliss Cheney M.D.   On: 05/05/2022 20:23    Procedures Procedures    Medications Ordered in ED Medications  HYDROcodone-acetaminophen (NORCO/VICODIN) 5-325 MG per tablet 2 tablet (2 tablets Oral Given 05/05/22 2003)    ED Course/ Medical Decision Making/ A&P Clinical Course as of 05/05/22 2123  Wynelle Link May 05, 2022  2121 On reevaluation, patient states that her pain is improved.  She has increased range of motion in the left knee for now.  I discussed the radiologic findings with her at the bedside.  All questions or concerns addressed. [CF]  2121 DG Knee Complete 4 Views Left I personally ordered and interpreted x-ray over the left knee which shows some mild arthritic changes without evidence of fracture or dislocation.  I agree with the radiologist interpretation. [CF]    Clinical  Course User Index [CF] [name], PA-C                           Medical Decision Making Temia Debroux is a 51 y.o. female patient presents to the emerged part with left knee pain.  We will get imaging as patient has never had the left knee imaged.  I have a low suspicion for any serious ligamentous injury today.  There is no calf tenderness to suggest DVT.  Compartments are soft.  We will give her 2 of Norco and plan to reassess.     Amount and/or Complexity of Data Reviewed Radiology: ordered. Decision-making details documented in ED Course.  Risk Prescription drug management. Risk Details: Patient feeling better after Norco as highlighted in ED course.  Going to have her follow-up with her orthopedist for further evaluation.  Again low suspicion for compartment syndrome, DVT, serious ligamentous injury.  This could be related to her arthritic changes or could be possible Baker's cyst.  She was encouraged to return to the emerge apartment for any worsening symptoms.  I instructed her to take her 800 mg of ibuprofen every 8 hours for the next 4 to 5 days.  Patient amenable to this plan.  Strict return precautions were discussed.  She is safe for discharge at this time.   Final Clinical Impression(s) / ED Diagnoses Final diagnoses:  Acute pain of left knee    Rx / DC Orders ED Discharge Orders     None         Jolyn Lent 05/05/22 2123    Derwood Kaplan, MD 05/06/22 1527

## 2022-05-05 NOTE — Discharge Instructions (Signed)
Please follow-up with your orthopedist for further evaluation.  As we discussed, please take 800 mg of ibuprofen every 8 hours for your knee pain.  You can use heat and ice.  Return to the emergency department for any worsening symptoms.

## 2022-05-05 NOTE — ED Triage Notes (Signed)
Pt with left knee pain.  Seen Dr. Romeo Apple this past Thursday. Denies any injury to knee.  Took Ibuprofen x 2 today.

## 2022-05-07 ENCOUNTER — Encounter: Payer: Self-pay | Admitting: Family Medicine

## 2022-05-07 ENCOUNTER — Ambulatory Visit (INDEPENDENT_AMBULATORY_CARE_PROVIDER_SITE_OTHER): Payer: Managed Care, Other (non HMO) | Admitting: Family Medicine

## 2022-05-07 VITALS — BP 121/77 | HR 84 | Ht 68.0 in | Wt 295.2 lb

## 2022-05-07 DIAGNOSIS — G500 Trigeminal neuralgia: Secondary | ICD-10-CM

## 2022-05-07 MED ORDER — PREDNISONE 10 MG (21) PO TBPK: ORAL_TABLET | ORAL | 0 refills | Status: DC

## 2022-05-07 MED ORDER — GABAPENTIN 300 MG PO CAPS: 300.0000 mg | ORAL_CAPSULE | Freq: Every day | ORAL | 3 refills | Status: DC

## 2022-05-07 NOTE — Progress Notes (Signed)
PATIENT: Beverly Mclaughlin DOB: [birth year]  REASON FOR VISIT: follow up HISTORY FROM: patient  Chief Complaint  Patient presents with   Follow-up    Rm 16, alone. R side pn to the touch, when eating or brushing her teeth. Ongoing for last 3 week but intensified within the last week.      HISTORY OF PRESENT ILLNESS:  05/07/22 ALL: Alonzo returns for follow up for right sided facial pain. She was last seen 09/2019. Gabapentin was continued at 648m TID and carbamazepine added. She reports taking meds for a few months but then discontinued as pain was resolved. She was doing well until three weeks ago. She reports pain returned over the right cheek. She reports pain radiates to the right eye and is sharp in nature. Pain last about 40 seconds then resolves. She has difficulty chewing when pain is present. Face is sensitive when she is washing her face. No drainage of eye or nose. No edema or significant tenderness of temporal region.   10/14/2019 ALL:  TTejal Monroyis a 51y.o. female here today for follow up of right sided face pain. She was seen in 04/2017 as a new patient. She was started on gabapentin and reports this worked very well. She discontinued gabapentin about 12-18 months ago. In October, 2020, she had her teeth cleaned. She has had significant worsening of right jaw pain. It starts at right lower lip and radiates to her right ear. She saw her PCP for jaw and shoulder pain. She was started on gabapentin 6032mTID and Tramadol 5025mt night. She did not get any relief and has increased dose.  She is now taking 1800m29mD (total 5400mg30mly). She does feel that she can get through the day at this dose but pain continues. She denies adverse effects with increased dose. She denies history of liver or kidney disease. MRI in 2018 showed white matter changes and T2 flair hyper intensities. Additional testing not consistent with MS. She had a repeat MRI in 2019 that did not show any changes.    HISTORY: (copied from Dr WilliJannifer Frankline on 04/29/2017)  Beverly Mclaughlin 44 ye44 old right-handed black female with a history of onset of right mid face discomfort that began in March 2018. The patient had just had a filling done on a tooth in the right maxillary area a few days prior to onset of symptoms. The patient has noted some sharp jabbing pains ago from the right upper lip to the orbital area on the right. The patient indicates that chewing or talking may increase the frequency of the pain. The patient was seen back by her dentist and the tooth was filed down slightly without benefit, the patient then had an evaluation through an oral surgeon and her wisdom teeth were removed along with another molar but the pain continued. The patient was seen by an endodontist, no dental abnormalities were noted. The patient was set up for a CT of the maxillofacial area which was unremarkable. The patient denies any numbness of the face, she has not had any weakness or numbness of extremities. At times she may have some slight imbalance, she denies issues controlling the bowels or the bladder. She has been placed on low-dose gabapentin taking 200 mg daily, this may have started to benefit the pain slightly. She is sent to this office for further evaluation.   REVIEW OF SYSTEMS: Out of a complete 14 system review of symptoms, the patient complains only of the  following symptoms, right sided facial pain and all other reviewed systems are negative.  ALLERGIES: Allergies  Allergen Reactions   Prilosec [Omeprazole] Hives and Rash    HOME MEDICATIONS: Outpatient Medications Prior to Visit  Medication Sig Dispense Refill   ibuprofen (ADVIL) 800 MG tablet Take 1 tablet (800 mg total) by mouth every 8 (eight) hours as needed. 90 tablet 1   methocarbamol (ROBAXIN) 500 MG tablet Take 1 tablet (500 mg total) by mouth 3 (three) times daily. 60 tablet 1   No facility-administered medications prior to visit.     PAST MEDICAL HISTORY: Past Medical History:  Diagnosis Date   Anemia    Facial pain    Trigeminal neuralgia 04/29/2017    Right V2    PAST SURGICAL HISTORY: Past Surgical History:  Procedure Laterality Date   ABDOMINAL HYSTERECTOMY  Jan 2017   -[facility]    TUBAL LIGATION      FAMILY HISTORY: Family History  Problem Relation Age of Onset   Heart disease Mother    Diabetes Mother    Multiple myeloma Mother    Kidney disease Mother    Diabetes Father    Kidney disease Father    Hypertension Sister    Diabetes Sister    Cancer Maternal Grandmother    Kidney disease Maternal Grandmother    Diabetes Paternal Grandfather    Kidney disease Maternal Uncle     SOCIAL HISTORY: Social History   Socioeconomic History   Marital status: Divorced    Spouse name: Not on file   Number of children: 2   Years of education: some college   Highest education level: Not on file  Occupational History   Occupation: Teacher, early years/pre  Tobacco Use   Smoking status: Never   Smokeless tobacco: Never  Vaping Use   Vaping Use: Never used  Substance and Sexual Activity   Alcohol use: Yes    Alcohol/week: 2.0 standard drinks of alcohol    Types: 1 Glasses of wine, 1 Cans of beer per week    Comment: occasionally   Drug use: No   Sexual activity: Yes    Birth control/protection: Surgical  Other Topics Concern   Not on file  Social History Narrative   Lives at home with two sons.   Right-handed.   2 cups coffee daily.   Social Determinants of Health   Financial Resource Strain: Not on file  Food Insecurity: Not on file  Transportation Needs: Not on file  Physical Activity: Not on file  Stress: Not on file  Social Connections: Not on file  Intimate Partner Violence: Not on file      PHYSICAL EXAM  Vitals:   05/07/22 1357  BP: 121/77  Pulse: 84  Weight: 295 lb 3.2 oz (133.9 kg)  Height: _0  (1.727 m)    Body mass index is 44.89  kg/m.  Generalized: Well developed, in no acute distress  Cardiology: normal rate and rhythm, no murmur noted Respiratory: clear to auscultation bilaterally  Neurological examination  Mentation: Alert oriented to time, place, history taking. Follows all commands speech and language fluent Cranial nerve II-XII: Pupils were equal round reactive to light. Extraocular movements were full, visual field were full on confrontational test. Facial sensation and strength were normal. Uvula tongue midline. Head turning and shoulder shrug  were normal and symmetric. Motor: The motor testing reveals 5 over 5 strength of all 4 extremities. Good symmetric motor tone is noted throughout.  Sensory: Sensory testing is intact  to soft touch on all 4 extremities. No evidence of extinction is noted. Tenderness with palpation of right lower jaw line. No numbness  Gait and station: Gait is normal.   DIAGNOSTIC DATA (LABS, IMAGING, TESTING) - I reviewed patient records, labs, notes, testing and imaging myself where available.      No data to display           Lab Results  Component Value Date   WBC 5.0 12/08/2020   HGB 12.5 12/08/2020   HCT 36.3 12/08/2020   MCV 93.8 12/08/2020   PLT 252 12/08/2020      Component Value Date/Time   NA 138 12/08/2020 0944   NA 139 04/29/2017 1035   K 4.5 12/08/2020 0944   CL 101 12/08/2020 0944   CO2 25 12/08/2020 0944   GLUCOSE 84 12/08/2020 0944   BUN 12 12/08/2020 0944   BUN 11 04/29/2017 1035   CREATININE 0.82 12/08/2020 0944   CALCIUM 9.3 12/08/2020 0944   PROT 6.7 12/08/2020 0944   PROT 7.1 04/29/2017 1035   ALBUMIN 4.1 04/29/2017 1035   AST 14 12/08/2020 0944   ALT 9 12/08/2020 0944   ALKPHOS 50 04/29/2017 1035   BILITOT 0.4 12/08/2020 0944   BILITOT <0.2 04/29/2017 1035   GFRNONAA 85 04/29/2017 1035   GFRNONAA >89 05/31/2015 0830   GFRAA 99 04/29/2017 1035   GFRAA >89 05/31/2015 0830   Lab Results  Component Value Date   CHOL 153 12/08/2020    HDL 65 12/08/2020   LDLCALC 73 12/08/2020   TRIG 69 12/08/2020   CHOLHDL 2.4 12/08/2020   Lab Results  Component Value Date   HGBA1C 5.0 12/07/2019   No results found for: "VITAMINB12" Lab Results  Component Value Date   TSH 1.10 12/07/2019     ASSESSMENT AND PLAN 51 y.o. year old female  has a past medical history of Anemia, Facial pain, and Trigeminal neuralgia (04/29/2017). here with     ICD-10-CM   1. Trigeminal neuralgia  G50.0        Tashona was doing very well until three weeks ago. I will start prednisone taper and gabapentin 357m daily at bedtime as needed. Avoidance of triggers discussed. She will call with any worsening or unresolved symptoms. She will follow-up with me in 1 year, sooner if needed.  She verbalizes understanding and agreement with this plan.   No orders of the defined types were placed in this encounter.    Meds ordered this encounter  Medications   gabapentin (NEURONTIN) 300 MG capsule    Sig: Take 1 capsule (300 mg total) by mouth at bedtime.    Dispense:  90 capsule    Refill:  3    Order Specific Question:   Supervising Provider    Answer:   AMelvenia Beam[[[id number]]  predniSONE (STERAPRED UNI-PAK 21 TAB) 10 MG (21) TBPK tablet    Sig: Taper pack as directed.    Dispense:  1 each    Refill:  0    Order Specific Question:   Supervising Provider    Answer:   AMelvenia Beam[[[id number]      GEX BMWUX FNP-C 05/07/2022, 2:42 PM [facility] [address] [place] [phone]

## 2022-05-07 NOTE — Patient Instructions (Signed)
Below is our plan:  We will start prednisone taper. I will also give you gabapentin 300mg  daily as needed.   Please make sure you are staying well hydrated. I recommend 50-60 ounces daily. Well balanced diet and regular exercise encouraged. Consistent sleep schedule with 6-8 hours recommended.   Please continue follow up with care team as directed.   Follow up with me in 1 year   You may receive a survey regarding today's visit. I encourage you to leave honest feed back as I do use this information to improve patient care. Thank you for seeing me today!

## 2022-05-10 ENCOUNTER — Ambulatory Visit: Payer: Managed Care, Other (non HMO) | Admitting: Orthopedic Surgery

## 2022-06-05 ENCOUNTER — Encounter: Payer: Self-pay | Admitting: Nurse Practitioner

## 2022-06-05 ENCOUNTER — Ambulatory Visit (INDEPENDENT_AMBULATORY_CARE_PROVIDER_SITE_OTHER): Payer: Managed Care, Other (non HMO) | Admitting: Nurse Practitioner

## 2022-06-05 VITALS — BP 120/80 | HR 82 | Ht 68.0 in | Wt 288.0 lb

## 2022-06-05 DIAGNOSIS — G8929 Other chronic pain: Secondary | ICD-10-CM | POA: Insufficient documentation

## 2022-06-05 DIAGNOSIS — Z139 Encounter for screening, unspecified: Secondary | ICD-10-CM

## 2022-06-05 DIAGNOSIS — R600 Localized edema: Secondary | ICD-10-CM

## 2022-06-05 DIAGNOSIS — M545 Low back pain, unspecified: Secondary | ICD-10-CM | POA: Diagnosis not present

## 2022-06-05 DIAGNOSIS — G500 Trigeminal neuralgia: Secondary | ICD-10-CM

## 2022-06-05 DIAGNOSIS — Z1231 Encounter for screening mammogram for malignant neoplasm of breast: Secondary | ICD-10-CM

## 2022-06-05 DIAGNOSIS — E661 Drug-induced obesity: Secondary | ICD-10-CM

## 2022-06-05 DIAGNOSIS — Z23 Encounter for immunization: Secondary | ICD-10-CM | POA: Insufficient documentation

## 2022-06-05 NOTE — Assessment & Plan Note (Signed)
Patient educated on CDC recommendation for the vaccine. Verbal consent was obtained from the patient, vaccine administered by nurse, no sign of adverse reactions noted at this time. Patient education on arm soreness and use of tylenol or ibuprofen for this patient  was discussed. Patient educated on the signs and symptoms of adverse effect and advise to contact the office if they occur.  

## 2022-06-05 NOTE — Assessment & Plan Note (Addendum)
Wt Readings from Last 3 Encounters:  06/05/22 288 lb (130.6 kg)  05/07/22 295 lb 3.2 oz (133.9 kg)  05/05/22 286 lb (129.7 kg)  Patient counseled on low-carb diet, engage in regular moderate exercises at least 150 minutes weekly

## 2022-06-05 NOTE — Patient Instructions (Addendum)
Please schedule your mammogram today  Get shingles vaccine today.   It is important that you exercise regularly at least 30 minutes 5 times a week.  Think about what you will eat, plan ahead. Choose " clean, green, fresh or frozen" over canned, processed or packaged foods which are more sugary, salty and fatty. 70 to 75% of food eaten should be vegetables and fruit. Three meals at set times with snacks allowed between meals, but they must be fruit or vegetables. Aim to eat over a 12 hour period , example 7 am to 7 pm, and STOP after  your last meal of the day. Drink water,generally about 64 ounces per day, no other drink is as healthy. Fruit juice is best enjoyed in a healthy way, by EATING the fruit.  Thanks for choosing [facility], we consider it a privelige to serve you.

## 2022-06-05 NOTE — Assessment & Plan Note (Addendum)
Patient encouraged to keep her legs elevated when sitting, wearing compression socks as needed, avoid salty foods.  On gabapentin which may be contributing to her edema

## 2022-06-05 NOTE — Assessment & Plan Note (Signed)
Chronic condition well-controlled Followed by neurologist Takes gabapentin 300 mg daily as needed at bedtime

## 2022-06-05 NOTE — Progress Notes (Signed)
New Patient Office Visit  Subjective    Patient ID: Beverly Mclaughlin, female    DOB: June 02, 1971  Age: 51 y.o. MRN: [id number]  CC:  Chief Complaint  Patient presents with   New Patient (Initial Visit)    np   Foot Swelling    Feet swelling for the last month around 7/8    HPI Beverly Mclaughlin with past medical history of trigeminal neuralgia, chronic low back pain without sciatica, obesity presents to establish care for her chronic medical conditions,  Previous patient of Dr Buelah Manis.   Patient complains of swelling to both foot, more so on the left since the past one month. Denies pain , fever, chills . Does a lot of sitting on the job.   Has had Hysterectomy  due to uterine prolapse , still has her ovaries   Due for shingles vaccine need for shingles vaccine discussed.  Shingles vaccine given today.  Due for mammogram referral for mammogram placed today.    Outpatient Encounter Medications as of 06/05/2022  Medication Sig   APPLE CIDER VINEGAR PO Take by mouth. 2 tablets once a day   gabapentin (NEURONTIN) 300 MG capsule Take 1 capsule (300 mg total) by mouth at bedtime.   ibuprofen (ADVIL) 800 MG tablet Take 1 tablet (800 mg total) by mouth every 8 (eight) hours as needed.   methocarbamol (ROBAXIN) 500 MG tablet Take 1 tablet (500 mg total) by mouth 3 (three) times daily.   [DISCONTINUED] predniSONE (STERAPRED UNI-PAK 21 TAB) 10 MG (21) TBPK tablet Taper pack as directed.   No facility-administered encounter medications on file as of 06/05/2022.    Past Medical History:  Diagnosis Date   Anemia    Facial pain    Trigeminal neuralgia 04/29/2017    Right V2    Past Surgical History:  Procedure Laterality Date   ABDOMINAL HYSTERECTOMY  Jan 2017   -[facility]    TUBAL LIGATION      Family History  Problem Relation Age of Onset   Heart disease Mother    Diabetes Mother    Multiple myeloma Mother    Kidney disease Mother    Diabetes Father    Kidney disease Father     Hypertension Sister    Diabetes Sister    Kidney disease Maternal Uncle    Cancer Maternal Grandmother    Kidney disease Maternal Grandmother    Breast cancer Maternal Grandmother    Diabetes Paternal Grandfather     Social History   Socioeconomic History   Marital status: Divorced    Spouse name: Not on file   Number of children: 2   Years of education: some college   Highest education level: Not on file  Occupational History   Occupation: Teacher, early years/pre  Tobacco Use   Smoking status: Never   Smokeless tobacco: Never  Vaping Use   Vaping Use: Never used  Substance and Sexual Activity   Alcohol use: Yes    Alcohol/week: 2.0 standard drinks of alcohol    Types: 1 Glasses of wine, 1 Cans of beer per week    Comment: occasionally   Drug use: No   Sexual activity: Yes    Birth control/protection: Surgical  Other Topics Concern   Not on file  Social History Narrative   Lives at home with two sons.   Right-handed.   2 cups coffee daily.   Social Determinants of Health   Financial Resource Strain: Not on file  Food Insecurity: Not on  file  Transportation Needs: Not on file  Physical Activity: Not on file  Stress: Not on file  Social Connections: Not on file  Intimate Partner Violence: Not on file    Review of Systems  Constitutional: Negative.  Negative for chills, fever and weight loss.  Respiratory:  Negative for cough, hemoptysis, sputum production and shortness of breath.   Cardiovascular:  Positive for leg swelling. Negative for chest pain, palpitations, orthopnea and claudication.  Genitourinary: Negative.  Negative for dysuria, frequency and urgency.  Musculoskeletal:  Positive for back pain. Negative for falls and neck pain.  Neurological:  Negative for dizziness, speech change, seizures, loss of consciousness and headaches.  Psychiatric/Behavioral: Negative.  Negative for depression, substance abuse and suicidal ideas.         Objective     BP 120/80 (BP Location: Right Arm, Patient Position: Sitting, Cuff Size: Large)   Pulse 82   Ht $R'5\' 8"'vZ$  (1.727 m)   Wt 288 lb (130.6 kg)   LMP 11/15/2015   SpO2 97%   BMI 43.79 kg/m   Physical Exam Constitutional:      General: She is not in acute distress.    Appearance: She is obese. She is not ill-appearing, toxic-appearing or diaphoretic.  HENT:     Right Ear: External ear normal.     Left Ear: External ear normal.  Cardiovascular:     Rate and Rhythm: Normal rate and regular rhythm.     Pulses: Normal pulses.     Heart sounds: Normal heart sounds. No murmur heard.    No friction rub. No gallop.  Pulmonary:     Effort: Pulmonary effort is normal. No respiratory distress.     Breath sounds: No stridor. No wheezing, rhonchi or rales.  Chest:     Chest wall: No tenderness.  Musculoskeletal:        General: No swelling, tenderness, deformity or signs of injury. Normal range of motion.     Right lower leg: Edema present.     Left lower leg: Edema present.     Comments: Non pitting edema BLE  Skin:    Capillary Refill: Capillary refill takes less than 2 seconds.     Coloration: Skin is not jaundiced or pale.     Findings: No bruising, erythema, lesion or rash.  Neurological:     Mental Status: She is alert and oriented to person, place, and time.     Cranial Nerves: No cranial nerve deficit.     Motor: No weakness.     Coordination: Coordination normal.     Gait: Gait normal.  Psychiatric:        Mood and Affect: Mood normal.        Behavior: Behavior normal.        Thought Content: Thought content normal.        Judgment: Judgment normal.         Assessment & Plan:   Problem List Items Addressed This Visit       Nervous and Auditory   Trigeminal neuralgia - Primary    Chronic condition well-controlled Followed by neurologist Takes gabapentin 300 mg daily as needed at bedtime        Other   Obesity    Wt Readings from Last 3 Encounters:  06/05/22  288 lb (130.6 kg)  05/07/22 295 lb 3.2 oz (133.9 kg)  05/05/22 286 lb (129.7 kg)  Patient counseled on low-carb diet, engage in regular moderate exercises at least 150 minutes weekly  Chronic low back pain without sciatica   Bilateral leg edema    Patient encouraged to keep her legs elevated when sitting, wearing compression socks as needed, avoid salty foods.  On gabapentin which may be contributing to her edema      Need for varicella vaccine    Patient educated on CDC recommendation for the vaccine. Verbal consent was obtained from the patient, vaccine administered by nurse, no sign of adverse reactions noted at this time. Patient education on arm soreness and use of tylenol or ibuprofen for this patient  was discussed. Patient educated on the signs and symptoms of adverse effect and advise to contact the office if they occur.       Relevant Orders   Varicella-zoster vaccine IM (Completed)   Other Visit Diagnoses     Encounter for screening mammogram for malignant neoplasm of breast       Relevant Orders   MM 3D SCREEN BREAST BILATERAL   Screening due       Relevant Orders   MM 3D SCREEN BREAST BILATERAL   Lipid Profile   CBC with Differential   CMP14+EGFR   Vitamin D (25 hydroxy)   TSH   HgB A1c       Return in about 1 month (around 07/06/2022) for CPE.   Renee Rival, FNP

## 2022-06-10 ENCOUNTER — Ambulatory Visit (HOSPITAL_COMMUNITY): Payer: Managed Care, Other (non HMO) | Admitting: Physical Therapy

## 2022-06-20 ENCOUNTER — Ambulatory Visit (HOSPITAL_COMMUNITY): Payer: Managed Care, Other (non HMO) | Admitting: Physical Therapy

## 2022-06-25 ENCOUNTER — Ambulatory Visit (HOSPITAL_COMMUNITY): Payer: Managed Care, Other (non HMO) | Attending: Orthopedic Surgery

## 2022-06-25 DIAGNOSIS — G8929 Other chronic pain: Secondary | ICD-10-CM | POA: Diagnosis not present

## 2022-06-25 DIAGNOSIS — M79605 Pain in left leg: Secondary | ICD-10-CM | POA: Diagnosis present

## 2022-06-25 DIAGNOSIS — M545 Low back pain, unspecified: Secondary | ICD-10-CM | POA: Diagnosis present

## 2022-06-25 NOTE — Therapy (Signed)
OUTPATIENT PHYSICAL THERAPY THORACOLUMBAR EVALUATION   Patient Name: Beverly Mclaughlin MRN: [id number] DOB:April 24, 1971, 51 y.o., female Today's Date: 06/25/2022   PT End of Session - 06/25/22 1608     Visit Number 1    Number of Visits 4    Date for PT Re-Evaluation 07/16/22    Authorization Type [employer] Managed    Progress Note Due on Visit 4    PT Start Time 1607    PT Stop Time 1644    PT Time Calculation (min) 37 min             Past Medical History:  Diagnosis Date   Anemia    Facial pain    Trigeminal neuralgia 04/29/2017    Right V2   Past Surgical History:  Procedure Laterality Date   ABDOMINAL HYSTERECTOMY  Jan 2017   -[facility]    TUBAL LIGATION     Patient Active Problem List   Diagnosis Date Noted   Chronic low back pain without sciatica 06/05/2022   Bilateral leg edema 06/05/2022   Need for varicella vaccine 06/05/2022   Right calf pain 10/19/2020   Trigeminal neuralgia 04/29/2017   H/O total vaginal hysterectomy 11/27/2016   Obesity 05/24/2014   Routine general medical examination at a health care facility 05/24/2014   Acid reflux 05/24/2014    PCP: Gilmore Laroche, FNP  REFERRING PROVIDER: Vickki Hearing, MD   REFERRING DIAG: M54.50,G89.29 (ICD-10-CM) - Chronic left-sided low back pain without sciatica   Rationale for Evaluation and Treatment Rehabilitation  THERAPY DIAG:  Low back pain, unspecified back pain laterality, unspecified chronicity, unspecified whether sciatica present  Pain in left leg  ONSET DATE: June 2023  SUBJECTIVE:                                                                                                                                                                                           SUBJECTIVE STATEMENT: Pain was going across low back; saw Romeo Apple; he gave ibuprofen; now back is feeling better but her left knee is bothering her now; reports some swelling and popping in the left knee PERTINENT  HISTORY:  Recent left knee pain; went to ER  PAIN:  Are you having pain? Yes: NPRS scale: left knee 0-10 back 0-10/10 Pain location: low back and left knee Pain description: back:achey, dull; spasms; knee-tight, pulling, sharp pain Aggravating factors: knee-prolonged sitting; back prolonged standing Relieving factors: ibuprofen 800   PRECAUTIONS: None  WEIGHT BEARING RESTRICTIONS No  FALLS:  Has patient fallen in last 6 months? 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   EXAM: LEFT KNEE - COMPLETE 4+ VIEW   COMPARISON:  None Available.   FINDINGS: There is no evidence for acute fracture or dislocation. There is mild patellofemoral compartment joint space narrowing. There is lateral and patellofemoral compartment osteophyte formation. There is no evidence for joint effusion or other soft tissue abnormality.   IMPRESSION: 1. No acute bony abnormality. 2. Mild degenerative changes   Lumbar spine x-rays back versus hip pain   X-ray shows curvature of the lumbar spine to the left lateral x-ray shows excessive lordosis lower 4 disc spaces are maintained without evidence of spondylolysis or listhesis set arthritis seen in the L3-L4 articulation L4-L5 and L5-S1   Impression spondylosis with reactive scoliosis   PATIENT SURVEYS:  FOTO 65    COGNITION:  Overall cognitive status: Within functional limits for tasks assessed     PALPATION: Tender left knee behind hamstring; lateral knee **Swelling noted superior lateral knee and down to foot and ankle  LUMBAR ROM:   Active  AROM  eval  Flexion 65% available; fingertips mid shin pulling  back of left knee  Extension 30% available  Right lateral flexion   Left lateral flexion   Right rotation   Left rotation    (Blank rows = not tested)   LOWER EXTREMITY MMT:    MMT Right eval Left eval  Hip flexion 5 4+  Hip extension 4 4-  Hip abduction    Hip adduction    Hip internal rotation    Hip external rotation    Knee flexion 5 4  Knee extension 5 4+  Ankle dorsiflexion 5 5  Ankle plantarflexion    Ankle inversion    Ankle eversion     (Blank rows = not tested)   GAIT: Distance walked: 50 Assistive device utilized: None Level of assistance: Complete Independence Comments: slight decreased gait speed    TODAY'S TREATMENT  Physical therapy evaluation and HEP instruction  PATIENT EDUCATION:  Education details: Patient educated on exam findings, POC, scope of PT, HEP. Person educated: Patient Education method: Explanation, Demonstration, and Handouts Education comprehension: verbalized understanding, returned demonstration, verbal cues required, and tactile cues required   HOME EXERCISE PROGRAM: 06/25/22 Prone hip extension , prone hamstring curls, supine bridge, supine left SLR  ASSESSMENT:  CLINICAL IMPRESSION: Patient is a 51 y.o. female  who was seen today for physical therapy evaluation and treatment for M54.50,G89.29 (ICD-10-CM) - Chronic left-sided low back pain without sciatica . She reports minimal back pain now but is having left leg; knee symptoms. Patient presents on evaluation with lower extremity weakness, swelling in left leg; decreased lumbar mobility and pain that negatively impacts her ability to work, drive, sleep and work out for fitness purposes. Patient will benefit from skilled therapy interventions to address deficits and promote optimal function. Consider that leg pain and swelling is more related to the knee than lumbar in origin.    OBJECTIVE IMPAIRMENTS Abnormal gait, decreased activity tolerance, decreased balance, decreased  endurance, decreased knowledge of condition, decreased mobility, difficulty walking, decreased ROM, decreased strength, hypomobility, increased edema, increased fascial restrictions, impaired perceived functional ability, impaired flexibility, and pain.   ACTIVITY LIMITATIONS carrying, lifting, bending, sitting, standing, squatting, sleeping, stairs, transfers, locomotion  level, and caring for others  PARTICIPATION LIMITATIONS: meal prep, cleaning, laundry, driving, shopping, community activity, and occupation   REHAB POTENTIAL: Good  CLINICAL DECISION MAKING: Stable/uncomplicated  EVALUATION COMPLEXITY: Moderate   GOALS: Goals reviewed with patient? No  SHORT TERM GOALS: Target date: 07/09/2022   patient will be independent with initial HEP  Baseline: Goal status: INITIAL  2.  Patient will increase lumbar mobility to full range without pain to improve ability to perform tasks in home. Baseline: see above Goal status: INITIAL  LONG TERM GOALS: Target date: 07/16/2022   Patient will be independent with advanced HEP and self management strategies to improve quality of life and functional outcomes. Baseline:  Goal status: INITIAL  2.  Patient will improve FOTO score to predicted value to demonstrate improved functional mobility  Baseline:  Goal status: INITIAL  3.   Patient will increase left leg to 5/5 to promote return to ambulation community distances with minimal deviation. Baseline:  Goal status: INITIAL    PLAN: PT FREQUENCY: 2x/week  PT DURATION: 2 weeks  PLANNED INTERVENTIONS: Therapeutic exercises, Therapeutic activity, Neuromuscular re-education, Balance training, Gait training, Patient/Family education, Joint manipulation, Joint mobilization, Stair training, Orthotic/Fit training, DME instructions, Aquatic Therapy, Dry Needling, Electrical stimulation, Spinal manipulation, Spinal mobilization, Cryotherapy, Moist heat, Compression bandaging, scar mobilization,  Splintting, Taping, Traction, Ultrasound, Ionotophoresis 4mg /ml Dexamethasone, and Manual therapy   PLAN FOR NEXT SESSION: Review HEP and goals; sees Aline Brochure 8/31 to check knee and back   5:18 PM, 06/25/22 Ileene Allie Small Jearline Hirschhorn MPT [facility] [place] [phone] [id number]

## 2022-06-27 ENCOUNTER — Ambulatory Visit (INDEPENDENT_AMBULATORY_CARE_PROVIDER_SITE_OTHER): Payer: Managed Care, Other (non HMO) | Admitting: Orthopedic Surgery

## 2022-06-27 ENCOUNTER — Encounter: Payer: Self-pay | Admitting: Orthopedic Surgery

## 2022-06-27 DIAGNOSIS — S83242A Other tear of medial meniscus, current injury, left knee, initial encounter: Secondary | ICD-10-CM | POA: Diagnosis not present

## 2022-06-27 NOTE — Progress Notes (Signed)
Chief Complaint  Patient presents with   Back Pain    Feels better    51 year old female evaluated for lower back pain sent for physical therapy for lumbar sacral facet arthritis and chronic left-sided lower back pain  Presents now with left knee pain  About 3 days after her last visit in July she had a locked knee episode went to the ER x-rays were negative  Physical therapy has seen her for the back checked her knee felt there was some swelling and then her foot is swollen as well  She has been evaluated by medical doctor as well  Her left foot is indeed swollen there is no calf pain tenderness negative Homans she has medial joint line tenderness positive McMurray side painful knee extension with decreased range of motion and a knee effusion  Recommend MRI left knee for acute medial meniscus tear  Rule out Baker's cyst as well

## 2022-07-09 ENCOUNTER — Encounter: Payer: Managed Care, Other (non HMO) | Admitting: Family Medicine

## 2022-07-11 ENCOUNTER — Telehealth (HOSPITAL_COMMUNITY): Payer: Self-pay

## 2022-07-11 ENCOUNTER — Encounter (HOSPITAL_COMMUNITY): Payer: Managed Care, Other (non HMO)

## 2022-07-11 NOTE — Telephone Encounter (Signed)
No show, called and left message concerning missed apt.  Included next apt date and time with contact information in message if needs to cancel/reschedule.  Becky Sax, LPTA/CLT; [facility] [phone]

## 2022-07-15 ENCOUNTER — Inpatient Hospital Stay (HOSPITAL_COMMUNITY): Admission: RE | Admit: 2022-07-15 | Payer: Managed Care, Other (non HMO) | Source: Ambulatory Visit

## 2022-07-16 ENCOUNTER — Encounter (HOSPITAL_COMMUNITY): Payer: Managed Care, Other (non HMO) | Admitting: Physical Therapy

## 2022-07-16 ENCOUNTER — Telehealth (HOSPITAL_COMMUNITY): Payer: Self-pay | Admitting: Physical Therapy

## 2022-07-16 NOTE — Telephone Encounter (Signed)
Second no-show to scheduled appointment. Left voicemail for patient, requested to call and confirm next appointment.   Candie Mile, PT, DPT Physical Therapist [facility]

## 2022-07-17 ENCOUNTER — Encounter (HOSPITAL_COMMUNITY): Payer: Managed Care, Other (non HMO)

## 2022-07-23 ENCOUNTER — Encounter (HOSPITAL_COMMUNITY): Payer: Managed Care, Other (non HMO)

## 2022-07-25 ENCOUNTER — Encounter (HOSPITAL_COMMUNITY): Payer: Managed Care, Other (non HMO)

## 2022-08-04 ENCOUNTER — Other Ambulatory Visit: Payer: Managed Care, Other (non HMO)

## 2022-08-09 ENCOUNTER — Encounter: Payer: Managed Care, Other (non HMO) | Admitting: Family Medicine

## 2022-08-28 ENCOUNTER — Encounter (HOSPITAL_COMMUNITY): Payer: Managed Care, Other (non HMO)

## 2022-08-28 ENCOUNTER — Telehealth (HOSPITAL_COMMUNITY): Payer: Self-pay

## 2022-08-28 NOTE — Telephone Encounter (Signed)
Spoke with patient regarding missed appointment today; she states she showed for her appointment but went to the wrong location.  She would like to continue with therapy.  Instructed patient to call back to schedule more appointments.    8:44 AM, 08/28/22 Beverly Mclaughlin [name] [name] MPT [place] [facility] [place] [phone]

## 2022-09-09 IMAGING — MG MM DIGITAL SCREENING BILAT W/ TOMO AND CAD
6 of 11 series · 6 of 31 positions shown · non-contrast
Comparison: Previous exam(s).

CLINICAL DATA: Screening.

EXAM:
DIGITAL SCREENING BILATERAL MAMMOGRAM WITH TOMOSYNTHESIS AND CAD
TECHNIQUE: Bilateral screening digital craniocaudal and mediolateral oblique
mammograms were obtained. Bilateral screening digital breast
tomosynthesis was performed. The images were evaluated with
computer-aided detection.

[R MLO]
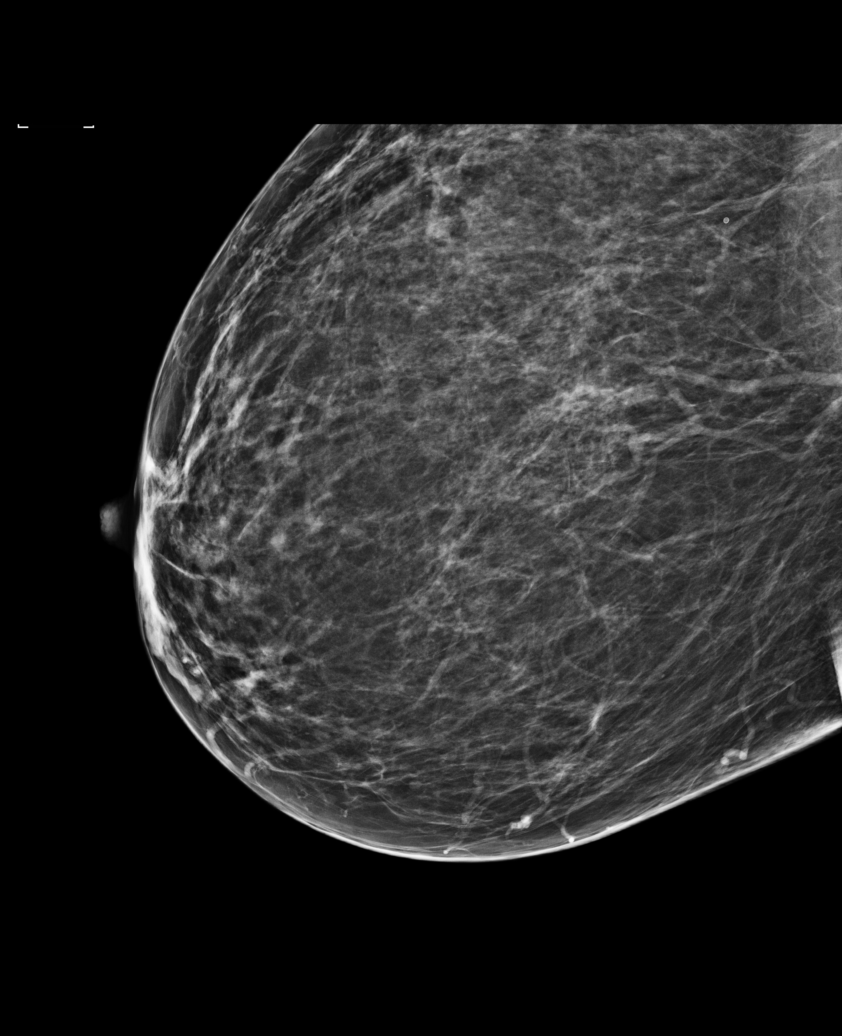

[R CC synth-2D]
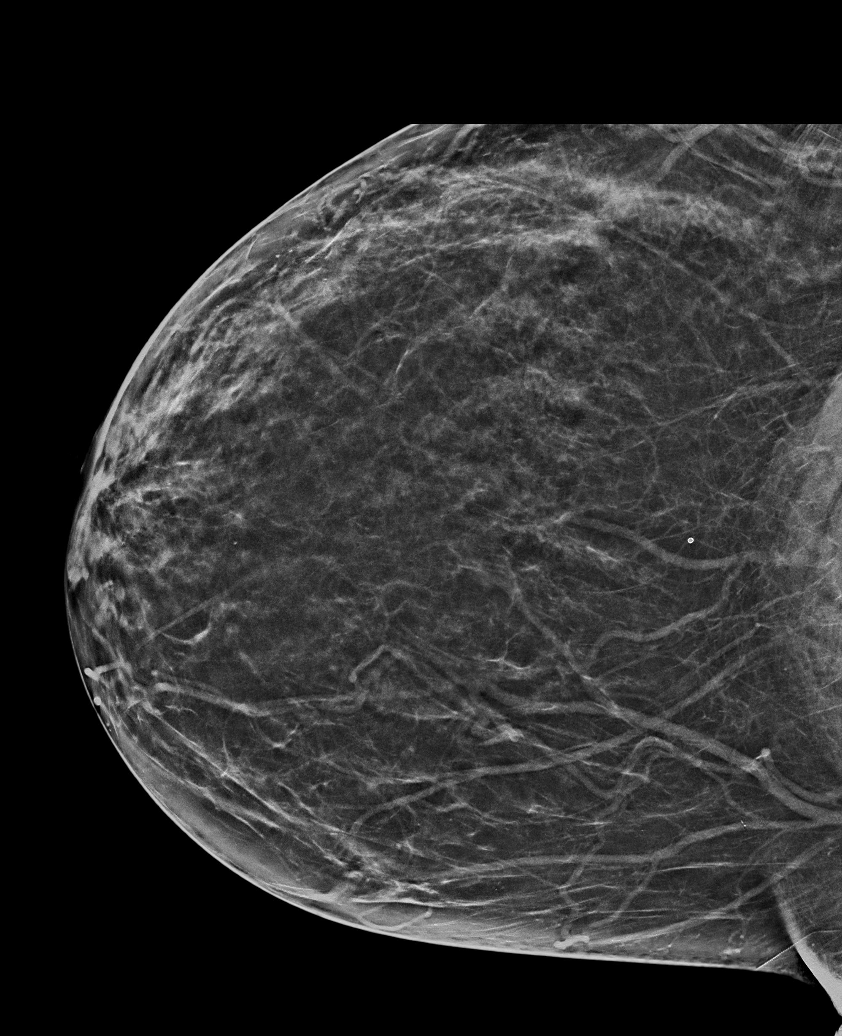

[L MLO synth-2D (1 of 2)]
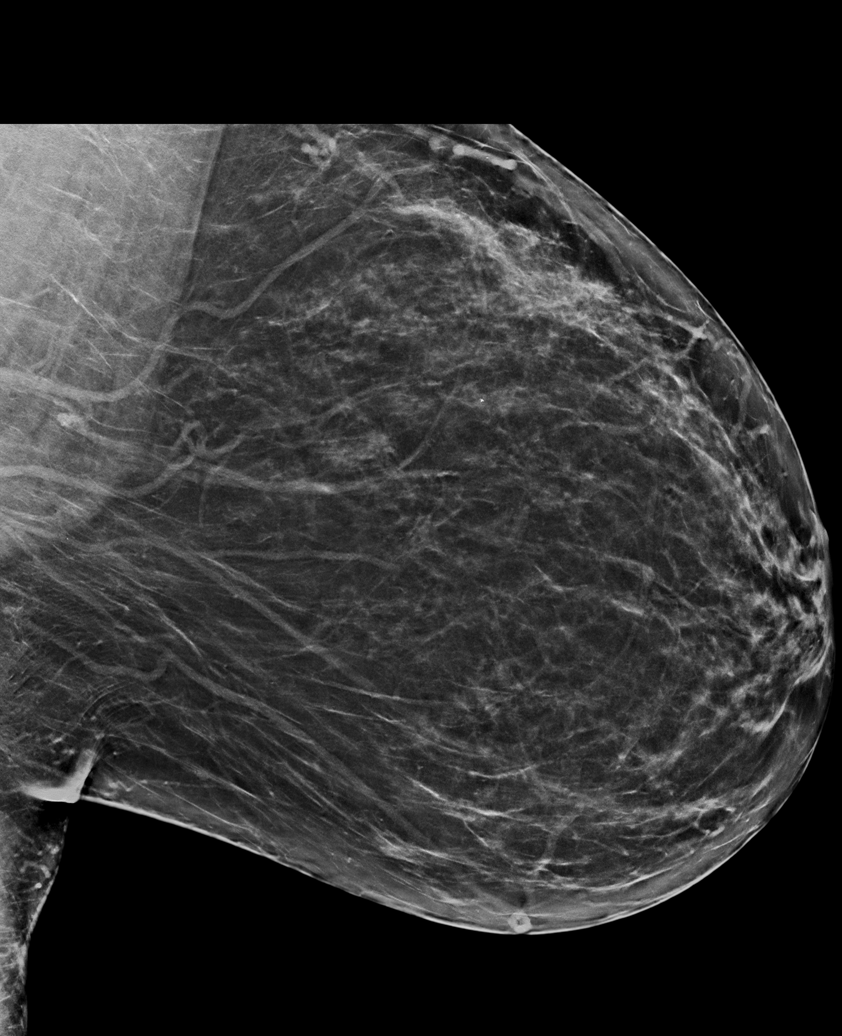

[L CC synth-2D]
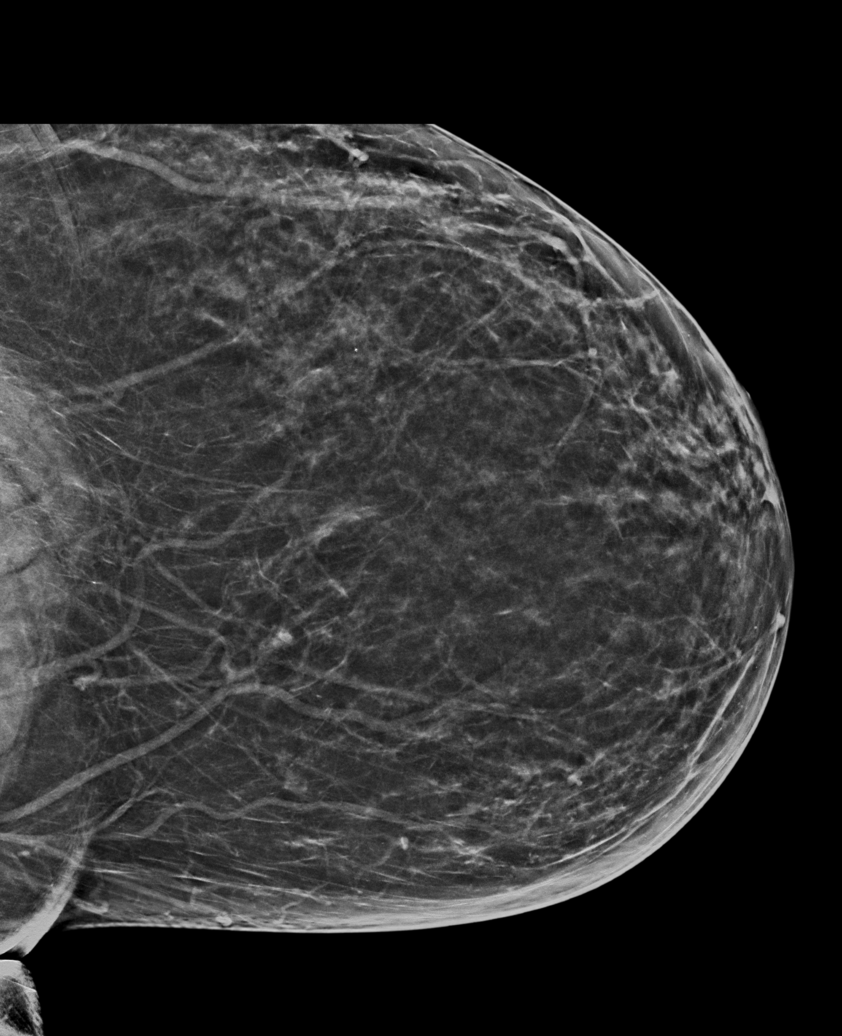

[L MLO synth-2D (2 of 2)]
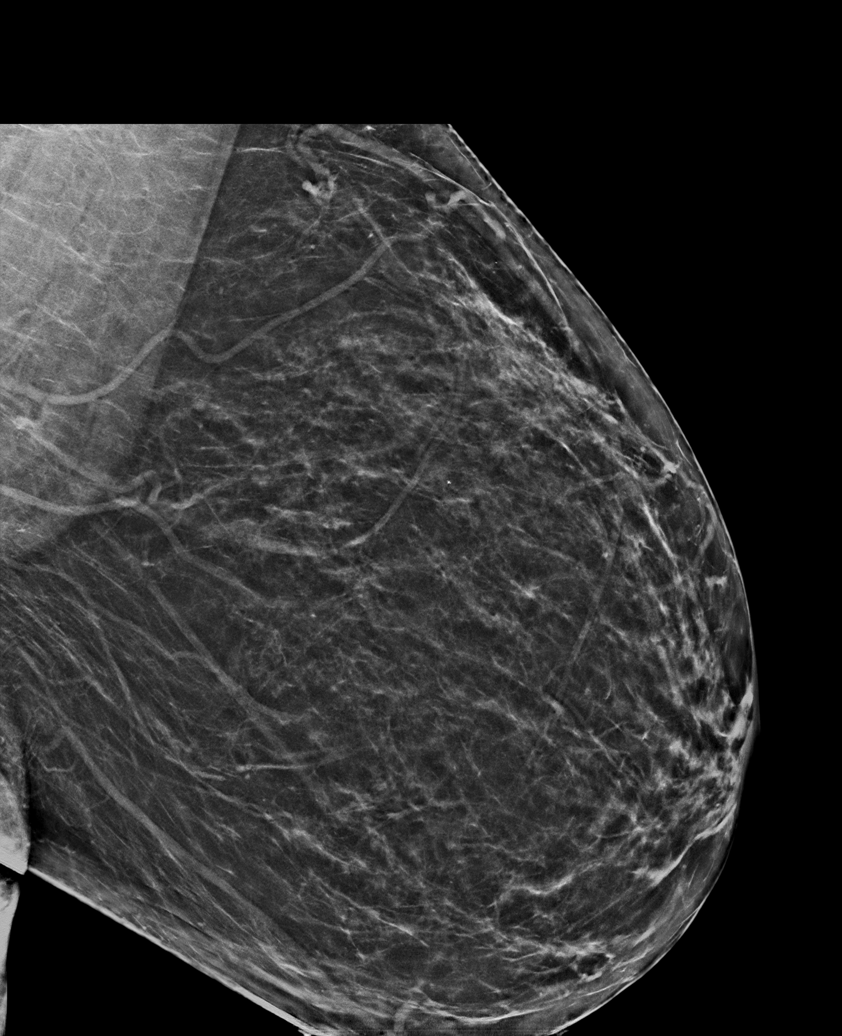

[R MLO synth-2D]
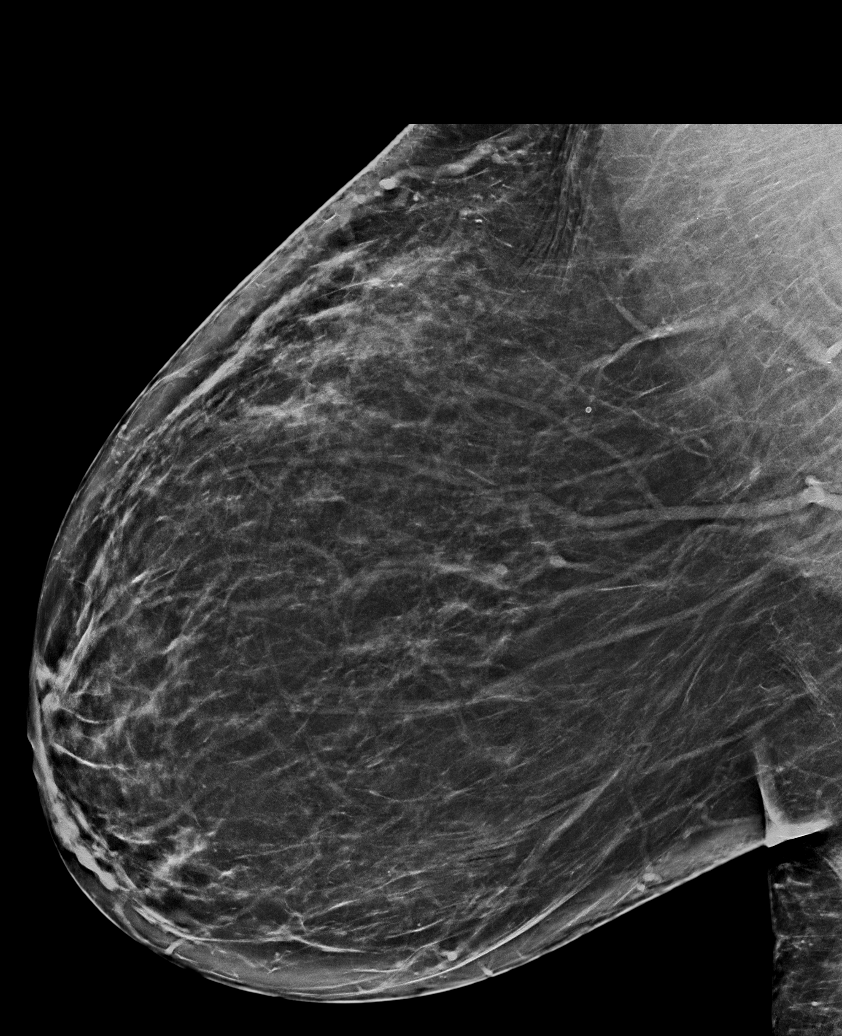

[6 of 31 positions shown; findings below may reference images not displayed]

ACR Breast Density Category b: There are scattered areas of
fibroglandular density.
FINDINGS: There are no findings suspicious for malignancy. The images were
evaluated with computer-aided detection.
IMPRESSION: No mammographic evidence of malignancy. A result letter of this
screening mammogram will be mailed directly to the patient.

RECOMMENDATION:
Screening mammogram in one year. (Code:[id number]-[id number])

BI-RADS CATEGORY  1: Negative.

## 2022-09-12 ENCOUNTER — Encounter: Payer: Self-pay | Admitting: Family Medicine

## 2022-09-12 ENCOUNTER — Ambulatory Visit (INDEPENDENT_AMBULATORY_CARE_PROVIDER_SITE_OTHER): Payer: 59 | Admitting: Family Medicine

## 2022-09-12 VITALS — BP 119/81 | HR 80 | Ht 68.0 in | Wt 293.1 lb

## 2022-09-12 DIAGNOSIS — R002 Palpitations: Secondary | ICD-10-CM | POA: Diagnosis not present

## 2022-09-12 DIAGNOSIS — R7301 Impaired fasting glucose: Secondary | ICD-10-CM

## 2022-09-12 DIAGNOSIS — Z0000 Encounter for general adult medical examination without abnormal findings: Secondary | ICD-10-CM

## 2022-09-12 DIAGNOSIS — M25561 Pain in right knee: Secondary | ICD-10-CM | POA: Diagnosis not present

## 2022-09-12 DIAGNOSIS — Z6841 Body Mass Index (BMI) 40.0 and over, adult: Secondary | ICD-10-CM

## 2022-09-12 DIAGNOSIS — Z23 Encounter for immunization: Secondary | ICD-10-CM | POA: Diagnosis not present

## 2022-09-12 DIAGNOSIS — E038 Other specified hypothyroidism: Secondary | ICD-10-CM

## 2022-09-12 DIAGNOSIS — Z0001 Encounter for general adult medical examination with abnormal findings: Secondary | ICD-10-CM | POA: Diagnosis not present

## 2022-09-12 DIAGNOSIS — E559 Vitamin D deficiency, unspecified: Secondary | ICD-10-CM | POA: Diagnosis not present

## 2022-09-12 DIAGNOSIS — E7849 Other hyperlipidemia: Secondary | ICD-10-CM

## 2022-09-12 NOTE — Assessment & Plan Note (Signed)
Encouraged lifestyle modification with heart healthy diet and increase physical activities Wt Readings from Last 3 Encounters:  09/12/22 293 lb 1.9 oz (133 kg)  06/05/22 288 lb (130.6 kg)  05/07/22 295 lb 3.2 oz (133.9 kg)

## 2022-09-12 NOTE — Progress Notes (Signed)
Complete physical exam  Patient: Beverly Mclaughlin   DOB: 10-Jan-1971   51 y.o. Female  MRN: [id number]  Subjective:    Chief Complaint  Patient presents with   Annual Exam    Cpe today, would like to discuss weight concerns.     Beverly Mclaughlin is a 51 y.o. female who presents today for a complete physical exam. She reports consuming a general diet. Gym/ health club routine includes cardio. She generally feels well. She reports sleeping well. She does have additional problems to discuss today.   Palpitation: She reports having palpitation at rest that sometimes wakes her up from sleep.   Palpitation wax and wanes. She reports a maternal family history of cardiovascular disease.  She denies anxiety and panic attacks. She denies chest pain, orthopnea, shortness of breath, and leg swelling  Right knee pain: She follows up with orthopedics for her right knee pain and reports having arthritis in her knees.  She denies knee effusion, redness, and warmth.  Obesity: She notes increased weight gain and reports that she has not been physically active due to pain in her right knee.  She reports that she will start implementing lifestyle changes.   Most recent fall risk assessment:    09/12/2022    4:29 PM  [place] in the past year? 0  Number falls in past yr: 0  Injury with Fall? 0  Risk for fall due to : No Fall Risks  Follow up Falls evaluation completed     Most recent depression screenings:    09/12/2022    4:29 PM 06/05/2022    8:57 AM  PHQ 2/9 Scores  PHQ - 2 Score 0 0  PHQ- 9 Score 0     Dental: No current dental problems and Last dental visit: 2022  Patient Active Problem List   Diagnosis Date Noted   Right knee pain 09/12/2022   Palpitation 09/12/2022   Encounter for general adult medical examination with abnormal findings 09/12/2022   Chronic low back pain without sciatica 06/05/2022   Bilateral leg edema 06/05/2022   Need for varicella vaccine 06/05/2022    Right calf pain 10/19/2020   Trigeminal neuralgia 04/29/2017   H/O total vaginal hysterectomy 11/27/2016   Obesity 05/24/2014   Routine general medical examination at a health care facility 05/24/2014   Acid reflux 05/24/2014   Past Medical History:  Diagnosis Date   Anemia    Facial pain    Trigeminal neuralgia 04/29/2017    Right V2   Past Surgical History:  Procedure Laterality Date   ABDOMINAL HYSTERECTOMY  Jan 2017   -[facility]    TUBAL LIGATION     Social History   Tobacco Use   Smoking status: Never   Smokeless tobacco: Never  Vaping Use   Vaping Use: Never used  Substance Use Topics   Alcohol use: Yes    Alcohol/week: 2.0 standard drinks of alcohol    Types: 1 Glasses of wine, 1 Cans of beer per week    Comment: occasionally   Drug use: No   Social History   Socioeconomic History   Marital status: Divorced    Spouse name: Not on file   Number of children: 2   Years of education: some college   Highest education level: Not on file  Occupational History   Occupation: Teacher, early years/pre  Tobacco Use   Smoking status: Never   Smokeless tobacco: Never  Vaping Use   Vaping Use: Never  used  Substance and Sexual Activity   Alcohol use: Yes    Alcohol/week: 2.0 standard drinks of alcohol    Types: 1 Glasses of wine, 1 Cans of beer per week    Comment: occasionally   Drug use: No   Sexual activity: Yes    Birth control/protection: Surgical  Other Topics Concern   Not on file  Social History Narrative   Lives at home with two sons.   Right-handed.   2 cups coffee daily.   Social Determinants of Health   Financial Resource Strain: Not on file  Food Insecurity: Not on file  Transportation Needs: Not on file  Physical Activity: Not on file  Stress: Not on file  Social Connections: Not on file  Intimate Partner Violence: Not on file   Family Status  Relation Name Status   Mother  Deceased   Father  Deceased at age 54   Sister  Alive    Sister  [place] colorectal  cancer [place]  Deceased   MGM  Deceased at age 55   MGF  Deceased at age 29   Cloverly  Deceased at age 41   PGF  Deceased at age 79   Family History  Problem Relation Age of Onset   Heart disease Mother    Diabetes Mother    Multiple myeloma Mother    Kidney disease Mother    Diabetes Father    Kidney disease Father    Hypertension Sister    Diabetes Sister    Kidney disease Maternal Uncle    Cancer Maternal Grandmother    Kidney disease Maternal Grandmother    Breast cancer Maternal Grandmother    Diabetes Paternal Grandfather    Allergies  Allergen Reactions   Prilosec [Omeprazole] Hives and Rash      Patient Care Team: Alvira Monday, FNP as PCP - General (Family Medicine)   Outpatient Medications Prior to Visit  Medication Sig   APPLE CIDER VINEGAR PO Take by mouth. 2 tablets once a day   gabapentin (NEURONTIN) 300 MG capsule Take 1 capsule (300 mg total) by mouth at bedtime.   ibuprofen (ADVIL) 800 MG tablet Take 1 tablet (800 mg total) by mouth every 8 (eight) hours as needed.   methocarbamol (ROBAXIN) 500 MG tablet Take 1 tablet (500 mg total) by mouth 3 (three) times daily.   No facility-administered medications prior to visit.    Review of Systems  Constitutional:  Negative for chills and fever.  HENT:  Negative for congestion and sinus pain.   Eyes:  Negative for discharge and redness.  Respiratory:  Negative for cough and shortness of breath.   Cardiovascular:  Positive for palpitations. Negative for chest pain and orthopnea.  Gastrointestinal:  Negative for constipation and diarrhea.  Genitourinary:  Negative for frequency and urgency.  Musculoskeletal:  Negative for back pain.       Right knee pain for 2-3 weeks  Skin:  Negative for itching.  Neurological:  Negative for dizziness and headaches.  Psychiatric/Behavioral:  Negative for suicidal ideas. The patient does not  have insomnia.        Objective:    BP 119/81   Pulse 80   Ht _0  (1.727 m)   Wt 293 lb 1.9 oz (133 kg)   LMP 11/15/2015   SpO2 97%   BMI 44.57 kg/m  BP Readings from Last 3 Encounters:  09/12/22 119/81  06/05/22 120/80  05/07/22 121/77   Wt Readings from Last 3 Encounters:  09/12/22 293 lb 1.9 oz (133 kg)  06/05/22 288 lb (130.6 kg)  05/07/22 295 lb 3.2 oz (133.9 kg)      Physical Exam HENT:     Head: Normocephalic.     Right Ear: External ear normal.     Left Ear: External ear normal.     Nose: No congestion.     Mouth/Throat:     Mouth: Mucous membranes are moist.  Eyes:     Extraocular Movements: Extraocular movements intact.     Pupils: Pupils are equal, round, and reactive to light.  Cardiovascular:     Rate and Rhythm: Normal rate and regular rhythm.     Pulses: Normal pulses.     Heart sounds: Normal heart sounds. No murmur heard. Pulmonary:     Breath sounds: Normal breath sounds. No wheezing.  Abdominal:     Palpations: Abdomen is soft.     Tenderness: There is no right CVA tenderness or left CVA tenderness.  Musculoskeletal:     Cervical back: No rigidity.     Right knee: Tenderness present.     Right lower leg: No edema.  Skin:    Findings: No lesion or rash.  Neurological:     Mental Status: She is alert and oriented to person, place, and time.     No results found for any visits on 09/12/22. Last CBC Lab Results  Component Value Date   WBC 5.0 12/08/2020   HGB 12.5 12/08/2020   HCT 36.3 12/08/2020   MCV 93.8 12/08/2020   MCH 32.3 12/08/2020   RDW 11.6 12/08/2020   PLT 252 50/06/3817   Last metabolic panel Lab Results  Component Value Date   GLUCOSE 84 12/08/2020   NA 138 12/08/2020   K 4.5 12/08/2020   CL 101 12/08/2020   CO2 25 12/08/2020   BUN 12 12/08/2020   CREATININE 0.82 12/08/2020   GFRNONAA 85 04/29/2017   CALCIUM 9.3 12/08/2020   PROT 6.7 12/08/2020   ALBUMIN 4.1 04/29/2017   LABGLOB 3.0 04/29/2017   AGRATIO  1.4 04/29/2017   BILITOT 0.4 12/08/2020   ALKPHOS 50 04/29/2017   AST 14 12/08/2020   ALT 9 12/08/2020   ANIONGAP 8 11/30/2015   Last lipids Lab Results  Component Value Date   CHOL 153 12/08/2020   HDL 65 12/08/2020   LDLCALC 73 12/08/2020   TRIG 69 12/08/2020   CHOLHDL 2.4 12/08/2020   Last hemoglobin A1c Lab Results  Component Value Date   HGBA1C 5.0 12/07/2019   Last thyroid functions Lab Results  Component Value Date   TSH 1.10 12/07/2019   Last vitamin D Lab Results  Component Value Date   VD25OH 40 05/24/2014   Last vitamin B12 and Folate No results found for: "VITAMINB12", "FOLATE"      Assessment & Plan:    Routine Health Maintenance and Physical Exam  Immunization History  Administered Date(s) Administered   Influenza, Quadrivalent, Recombinant, Inj, Pf 08/01/2022   Influenza,inj,Quad PF,6+ Mos 10/06/2014   Influenza-Unspecified 08/29/2019, 07/28/2020   Moderna Sars-Covid-2 Vaccination 12/30/2019, 01/28/2020   Tdap 05/11/2018   Zoster Recombinat (Shingrix) 06/05/2022, 09/12/2022    Health Maintenance  Topic Date Due   MAMMOGRAM  01/02/2023   Fecal DNA (Cologuard)  01/02/2023   TETANUS/TDAP  05/11/2028   INFLUENZA VACCINE  Completed   Hepatitis C Screening  Completed   HIV Screening  Completed  Zoster Vaccines- Shingrix  Completed   HPV VACCINES  Aged Out   COVID-19 Vaccine  Discontinued    Discussed health benefits of physical activity, and encouraged her to engage in regular exercise appropriate for her age and condition.  Palpitation Assessment & Plan: EKG in the clinic shows sinus rhythm Referral placed to cardiology for further evaluation of patient's palpitation Low suspicion for heart failure She reports maternal history of CHF  Orders: -     Ambulatory referral to Cardiology  Annual physical exam -     EKG 12-Lead  Class 3 severe obesity due to excess calories with body mass index (BMI) of 40.0 to 44.9 in adult,  unspecified whether serious comorbidity present [facility]) Assessment & Plan: Encouraged lifestyle modification with heart healthy diet and increase physical activities Wt Readings from Last 3 Encounters:  09/12/22 293 lb 1.9 oz (133 kg)  06/05/22 288 lb (130.6 kg)  05/07/22 295 lb 3.2 oz (133.9 kg)      Acute pain of right knee Assessment & Plan: She follows up with orthopedics for her right knee pain and reports having arthritis in her knees She denies knee effusion, redness, and warmth. Encouraged to continue follow-up with orthopedics   Encounter for general adult medical examination with abnormal findings Assessment & Plan: Physical exam as documented Counseling is done on healthy lifestyle involving commitment to 150 minutes of exercise per week,  Discussed heart-healthy diet and attaining a healthy weight We will schedule mammogram for patient today She received her shingles vaccination today       Immunization due -     Varicella-zoster vaccine IM  IFG (impaired fasting glucose) -     Hemoglobin A1c  Vitamin D deficiency -     VITAMIN D 25 Hydroxy (Vit-D Deficiency, Fractures)  Other specified hypothyroidism -     TSH + free T4  Other hyperlipidemia -     Lipid panel -     CMP14+EGFR -     CBC with Differential/Platelet    Return in about 3 months (around 12/13/2022).     Alvira Monday, FNP

## 2022-09-12 NOTE — Assessment & Plan Note (Addendum)
EKG in the clinic shows sinus rhythm Referral placed to cardiology for further evaluation of patient's palpitation Low suspicion for heart failure She reports maternal history of CHF

## 2022-09-12 NOTE — Patient Instructions (Addendum)
I appreciate the opportunity to provide care to you today!    Follow up:  3 months  Labs: please stop by the lab tomorrow to get your blood drawn (CBC, CMP, TSH, Lipid profile, HgA1c, Vit D)  Referrals today- cardiology   Please continue to a heart-healthy diet and increase your physical activities. Try to exercise for at least three times a week.      It was a pleasure to see you and I look forward to continuing to work together on your health and well-being. Please do not hesitate to call the office if you need care or have questions about your care.   Have a wonderful day and week. With Gratitude, Gilmore Laroche MSN, FNP-BC

## 2022-09-12 NOTE — Assessment & Plan Note (Signed)
She follows up with orthopedics for her right knee pain and reports having arthritis in her knees She denies knee effusion, redness, and warmth. Encouraged to continue follow-up with orthopedics

## 2022-09-12 NOTE — Assessment & Plan Note (Signed)
Physical exam as documented Counseling is done on healthy lifestyle involving commitment to 150 minutes of exercise per week,  Discussed heart-healthy diet and attaining a healthy weight We will schedule mammogram for patient today She received her shingles vaccination today

## 2022-09-13 ENCOUNTER — Other Ambulatory Visit (HOSPITAL_COMMUNITY): Payer: Self-pay | Admitting: Family Medicine

## 2022-09-13 DIAGNOSIS — Z139 Encounter for screening, unspecified: Secondary | ICD-10-CM

## 2022-09-13 DIAGNOSIS — Z23 Encounter for immunization: Secondary | ICD-10-CM

## 2022-09-13 DIAGNOSIS — Z1231 Encounter for screening mammogram for malignant neoplasm of breast: Secondary | ICD-10-CM

## 2022-09-13 DIAGNOSIS — G500 Trigeminal neuralgia: Secondary | ICD-10-CM

## 2022-09-13 DIAGNOSIS — R600 Localized edema: Secondary | ICD-10-CM

## 2022-09-13 DIAGNOSIS — G8929 Other chronic pain: Secondary | ICD-10-CM

## 2022-09-14 LAB — TSH+FREE T4
Free T4: 1.23 ng/dL (ref 0.82–1.77)
TSH: 1.1 u[IU]/mL (ref 0.450–4.500)

## 2022-09-14 LAB — CBC WITH DIFFERENTIAL/PLATELET
Basophils Absolute: 0 10*3/uL (ref 0.0–0.2)
Basos: 0 %
EOS (ABSOLUTE): 0 10*3/uL (ref 0.0–0.4)
Eos: 0 %
Hematocrit: 35.6 % (ref 34.0–46.6)
Hemoglobin: 12 g/dL (ref 11.1–15.9)
Immature Grans (Abs): 0 10*3/uL (ref 0.0–0.1)
Immature Granulocytes: 0 %
Lymphocytes Absolute: 1 10*3/uL (ref 0.7–3.1)
Lymphs: 14 %
MCH: 31.2 pg (ref 26.6–33.0)
MCHC: 33.7 g/dL (ref 31.5–35.7)
MCV: 93 fL (ref 79–97)
Monocytes Absolute: 0.3 10*3/uL (ref 0.1–0.9)
Monocytes: 5 %
Neutrophils Absolute: 5.9 10*3/uL (ref 1.4–7.0)
Neutrophils: 81 %
Platelets: 198 10*3/uL (ref 150–450)
RBC: 3.85 x10E6/uL (ref 3.77–5.28)
RDW: 10.9 % — ABNORMAL LOW (ref 11.7–15.4)
WBC: 7.3 10*3/uL (ref 3.4–10.8)

## 2022-09-14 LAB — HEMOGLOBIN A1C
Est. average glucose Bld gHb Est-mCnc: 100 mg/dL
Hgb A1c MFr Bld: 5.1 % (ref 4.8–5.6)

## 2022-09-14 LAB — CMP14+EGFR
ALT: 11 IU/L (ref 0–32)
AST: 17 IU/L (ref 0–40)
Albumin/Globulin Ratio: 1.6 (ref 1.2–2.2)
Albumin: 4.1 g/dL (ref 3.8–4.9)
Alkaline Phosphatase: 54 IU/L (ref 44–121)
BUN/Creatinine Ratio: 12 (ref 9–23)
BUN: 10 mg/dL (ref 6–24)
Bilirubin Total: 0.4 mg/dL (ref 0.0–1.2)
CO2: 23 mmol/L (ref 20–29)
Calcium: 9.4 mg/dL (ref 8.7–10.2)
Chloride: 102 mmol/L (ref 96–106)
Creatinine, Ser: 0.81 mg/dL (ref 0.57–1.00)
Globulin, Total: 2.6 g/dL (ref 1.5–4.5)
Glucose: 86 mg/dL (ref 70–99)
Potassium: 4.3 mmol/L (ref 3.5–5.2)
Sodium: 138 mmol/L (ref 134–144)
Total Protein: 6.7 g/dL (ref 6.0–8.5)
eGFR: 87 mL/min/{1.73_m2} (ref >59)

## 2022-09-14 LAB — LIPID PANEL
Chol/HDL Ratio: 2 ratio (ref 0.0–4.4)
Cholesterol, Total: 150 mg/dL (ref 100–199)
HDL: 74 mg/dL (ref >39)
LDL Chol Calc (NIH): 64 mg/dL (ref 0–99)
Triglycerides: 59 mg/dL (ref 0–149)
VLDL Cholesterol Cal: 12 mg/dL (ref 5–40)

## 2022-09-14 LAB — VITAMIN D 25 HYDROXY (VIT D DEFICIENCY, FRACTURES): Vit D, 25-Hydroxy: 22.4 ng/mL — ABNORMAL LOW (ref 30.0–100.0)

## 2022-09-16 ENCOUNTER — Other Ambulatory Visit: Payer: Self-pay | Admitting: Family Medicine

## 2022-09-16 ENCOUNTER — Telehealth: Payer: Self-pay | Admitting: Radiology

## 2022-09-16 DIAGNOSIS — E559 Vitamin D deficiency, unspecified: Secondary | ICD-10-CM

## 2022-09-16 MED ORDER — VITAMIN D (ERGOCALCIFEROL) 1.25 MG (50000 UNIT) PO CAPS: 50000.0000 [IU] | ORAL_CAPSULE | ORAL | 2 refills | Status: DC

## 2022-09-16 NOTE — Telephone Encounter (Signed)
Patient sent a MyChart message asking if she can have an MRI of the R knee as well, at same time as L knee.  Told her it would be next week before Dr Romeo Apple can advise.

## 2022-09-18 ENCOUNTER — Encounter: Payer: Self-pay | Admitting: Internal Medicine

## 2022-09-18 ENCOUNTER — Ambulatory Visit: Payer: 59 | Attending: Internal Medicine | Admitting: Internal Medicine

## 2022-09-18 VITALS — BP 120/72 | HR 74 | Ht 68.0 in | Wt 293.0 lb

## 2022-09-18 DIAGNOSIS — R002 Palpitations: Secondary | ICD-10-CM | POA: Diagnosis not present

## 2022-09-18 NOTE — Progress Notes (Signed)
Cardiology Office Note  Date: 09/18/2022   ID: [name], DOB [birth year], MRN [id number]  PCP:  Alvira Monday, [place]  Cardiologist:  Chalmers Guest, MD Electrophysiologist:  None   Reason for Office Visit: Palpitations at the request of [place], FNP   History of Present Illness: Beverly Mclaughlin is a 51 y.o. female known to have trigeminal neuralgia was referred to cardiology clinic for evaluation of palpitations.  Patient reported having palpitations lasting for a few seconds (1 second to 20 seconds), occurring at rest and resolve spontaneously. Frequency has been once in 6 weeks or 2 months. Denied having angina, DOE, dizziness/lightheadedness, syncope, LE swelling, claudication. Denies smoking cigarettes, alcohol use or illicit drug abuse.  No family history of premature ASCVD. Although she has family history of CHF in her mother who had defibrillator. Past Medical History:  Diagnosis Date   Anemia    Facial pain    Trigeminal neuralgia 04/29/2017    Right V2    Past Surgical History:  Procedure Laterality Date   ABDOMINAL HYSTERECTOMY  Jan 2017   -[facility]    TUBAL LIGATION      Current Outpatient Medications  Medication Sig Dispense Refill   APPLE CIDER VINEGAR PO Take by mouth. 2 tablets once a day     gabapentin (NEURONTIN) 300 MG capsule Take 1 capsule (300 mg total) by mouth at bedtime. 90 capsule 3   ibuprofen (ADVIL) 800 MG tablet Take 1 tablet (800 mg total) by mouth every 8 (eight) hours as needed. 90 tablet 1   methocarbamol (ROBAXIN) 500 MG tablet Take 1 tablet (500 mg total) by mouth 3 (three) times daily. 60 tablet 1   Vitamin D, Ergocalciferol, (DRISDOL) 1.25 MG (50000 UNIT) CAPS capsule Take 1 capsule (50,000 Units total) by mouth every 7 (seven) days. 5 capsule 2   No current facility-administered medications for this visit.   Allergies:  Prilosec [omeprazole]   Social History: The patient  reports that she has never smoked. She has  never used smokeless tobacco. She reports current alcohol use of about 2.0 standard drinks of alcohol per week. She reports that she does not use drugs.   Family History: The patient's family history includes Breast cancer in her maternal grandmother; Cancer in her maternal grandmother; Diabetes in her father, mother, paternal grandfather, and sister; Heart disease in her mother; Hypertension in her sister; Kidney disease in her father, maternal grandmother, maternal uncle, and mother; Multiple myeloma in her mother.   ROS:  Please see the history of present illness. Otherwise, complete review of systems is positive for none.  All other systems are reviewed and negative.   Physical Exam: VS:  BP 120/72   Pulse 74   Ht _0  (1.727 m)   Wt 293 lb (132.9 kg)   LMP 11/15/2015   SpO2 97%   BMI 44.55 kg/m , BMI Body mass index is 44.55 kg/m.  Wt Readings from Last 3 Encounters:  09/18/22 293 lb (132.9 kg)  09/12/22 293 lb 1.9 oz (133 kg)  06/05/22 288 lb (130.6 kg)    General: Patient appears comfortable at rest. HEENT: Conjunctiva and lids normal, oropharynx clear with moist mucosa. Neck: Supple, no elevated JVP or carotid bruits, no thyromegaly. Lungs: Clear to auscultation, nonlabored breathing at rest. Cardiac: Regular rate and rhythm, no S3 or significant systolic murmur, no pericardial rub. Abdomen: Soft, nontender, no hepatomegaly, bowel sounds present, no guarding or rebound. Extremities: No pitting edema, distal pulses 2+. Skin: Warm  and dry. Musculoskeletal: No kyphosis. Neuropsychiatric: Alert and oriented x3, affect grossly appropriate.  ECG: Normal sinus rhythm and no ST-T changes  Recent Labwork: 09/13/2022: ALT 11; AST 17; BUN 10; Creatinine, Ser 0.81; Hemoglobin 12.0; Platelets 198; Potassium 4.3; Sodium 138; TSH 1.100     Component Value Date/Time   CHOL 150 09/13/2022 0807   TRIG 59 09/13/2022 0807   HDL 74 09/13/2022 0807   CHOLHDL 2.0 09/13/2022 0807    CHOLHDL 2.4 12/08/2020 0944   VLDL 11 05/31/2015 0830   LDLCALC 64 09/13/2022 0807   LDLCALC 73 12/08/2020 0944    Other Studies Reviewed Today:   Assessment and Plan: Patient is a 51 year old F known to have trigeminal neuralgia was referred to cardiology clinic for evaluation of palpitations.  # Palpitations -Patient has been having palpitations once in 6 weeks or 2 months and lasting for a few seconds. I would not recommend a heart monitor for occasional palpitations.  Patient is instructed to call the clinic if she has frequent palpitations and at that time, I will recommend a heart monitor. -Cut down caffeine and patient voiced understanding.  I have spent a total of 33 minutes with patient reviewing chart, EKGs, labs and examining patient as well as establishing an assessment and plan that was discussed with the patient.  > 50% of time was spent in direct patient care.      Medication Adjustments/Labs and Tests Ordered: Current medicines are reviewed at length with the patient today.  Concerns regarding medicines are outlined above.   Tests Ordered: No orders of the defined types were placed in this encounter.   Medication Changes: No orders of the defined types were placed in this encounter.   Disposition:  Follow up prn  Signed Delance Weide Fidel Levy, MD, 09/18/2022 3:08 PM    [facility] [place], [place],  [address]

## 2022-09-18 NOTE — Patient Instructions (Signed)
Medication Instructions:  Your physician recommends that you continue on your current medications as directed. Please refer to the Current Medication list given to you today.   Labwork: none  Testing/Procedures: none  Follow-Up:  Your physician recommends that you schedule a follow-up appointment in: As needed  Any Other Special Instructions Will Be Listed Below (If Applicable).  Please call the office to discuss wearing a heart monitor if you have continued palpitations.  If you need a refill on your cardiac medications before your next appointment, please call your pharmacy.

## 2022-09-23 NOTE — Telephone Encounter (Signed)
No, not without another exam and this week is full

## 2022-09-24 ENCOUNTER — Telehealth: Payer: Self-pay | Admitting: Orthopedic Surgery

## 2022-09-24 NOTE — Telephone Encounter (Signed)
Patient called and lvm stating that she is having her MRI of her left knee on 09/29/22 at [facility] and it needs prior authorization before 09/28/22.  She also asked about adding her right knee for a MRI.  I have lvm for the patient that she'll need an appointment for her right knee, that it can't be added to her current order per Dr. Mort Sawyers previous response.  Patient's # [phone]

## 2022-09-27 ENCOUNTER — Ambulatory Visit (HOSPITAL_COMMUNITY)
Admission: RE | Admit: 2022-09-27 | Discharge: 2022-09-27 | Disposition: A | Discharge status: Home / Self Care | Payer: 59 | Source: Ambulatory Visit | Attending: Nurse Practitioner | Admitting: Nurse Practitioner

## 2022-09-27 DIAGNOSIS — Z139 Encounter for screening, unspecified: Secondary | ICD-10-CM | POA: Diagnosis present

## 2022-09-27 DIAGNOSIS — Z1231 Encounter for screening mammogram for malignant neoplasm of breast: Secondary | ICD-10-CM | POA: Diagnosis not present

## 2022-09-29 ENCOUNTER — Ambulatory Visit
Admission: RE | Admit: 2022-09-29 | Discharge: 2022-09-29 | Disposition: A | Discharge status: Home / Self Care | Payer: 59 | Source: Ambulatory Visit | Attending: Orthopedic Surgery | Admitting: Orthopedic Surgery

## 2022-09-29 DIAGNOSIS — S83242A Other tear of medial meniscus, current injury, left knee, initial encounter: Secondary | ICD-10-CM

## 2022-10-09 ENCOUNTER — Ambulatory Visit (INDEPENDENT_AMBULATORY_CARE_PROVIDER_SITE_OTHER): Payer: 59 | Admitting: Orthopedic Surgery

## 2022-10-09 DIAGNOSIS — S83242D Other tear of medial meniscus, current injury, left knee, subsequent encounter: Secondary | ICD-10-CM

## 2022-10-09 DIAGNOSIS — S83241A Other tear of medial meniscus, current injury, right knee, initial encounter: Secondary | ICD-10-CM | POA: Diagnosis not present

## 2022-10-09 NOTE — Addendum Note (Signed)
Addended by: Debby Bud R on: 10/09/2022 09:32 AM   Modules accepted: Orders

## 2022-10-09 NOTE — Progress Notes (Signed)
Virtual Visit   I connected with Beverly Mclaughlin on 10/09/22 at  9:00 AM EST by a video enabled telemedicine application and verified that I am speaking with the correct person using two identifiers.  Location: Patient: Outside the office Provider: Office   I discussed the limitations of evaluation and management by telemedicine and the availability of in person appointments. The patient expressed understanding and agreed to proceed.  History of Present Illness: This is a 51 year old female presented with difficulty with her left knee.  Back in July she experienced a locking episode of the left knee went to the emergency room x-rays were negative  She was seen by the physical therapist for her back and left knee as well she noticed some swelling in the knee and foot and was seen by her primary care physician  Her examination of the knee showed a positive McMurray's on the left painful knee extension decreased range of motion right knee effusion  She had her MRI and is now noting that her left knee has improved but her right knee is significantly worse   She now complains of right knee pain for 4 weeks no history of injury but she has pain with weightbearing soreness tenderness stiffness after sitting.  She tried ibuprofen it did not help she tried Biofreeze it did not help she did take some Aleve which gave her some relief    Observations/Objective:  The right knee now exhibits decreased range of motion pain with extension pain is located along the medial joint line she is having trouble with ADLs such as putting her pants on and there is tenderness over the medial compartment loss   Assessment and Plan: 51 year old female with atraumatic onset of right knee pain stiffness and difficulty with knee extension    Recommend MRI right knee  She can continue Aleve  Follow Up Instructions:  MRI can be done at [facility] imaging follow-up with virtual visit  If for some reason the MRI  is not approved the patient will be called in for clinical evaluation and x-ray  I discussed the assessment and treatment plan with the patient. The patient was provided an opportunity to ask questions and all were answered. The patient agreed with the plan and demonstrated an understanding of the instructions.   The patient was advised to call back or seek an in-person evaluation if the symptoms worsen or if the condition fails to improve as anticipated.  I provided 15 minutes of non-face-to-face time during this encounter.   Fuller Canada, MD

## 2022-10-17 ENCOUNTER — Ambulatory Visit (INDEPENDENT_AMBULATORY_CARE_PROVIDER_SITE_OTHER): Payer: 59

## 2022-10-17 ENCOUNTER — Ambulatory Visit (INDEPENDENT_AMBULATORY_CARE_PROVIDER_SITE_OTHER): Payer: 59 | Admitting: Orthopedic Surgery

## 2022-10-17 ENCOUNTER — Encounter: Payer: Self-pay | Admitting: Orthopedic Surgery

## 2022-10-17 DIAGNOSIS — G8929 Other chronic pain: Secondary | ICD-10-CM

## 2022-10-17 DIAGNOSIS — M1711 Unilateral primary osteoarthritis, right knee: Secondary | ICD-10-CM | POA: Diagnosis not present

## 2022-10-17 DIAGNOSIS — M7051 Other bursitis of knee, right knee: Secondary | ICD-10-CM

## 2022-10-17 MED ORDER — METHYLPREDNISOLONE ACETATE 40 MG/ML IJ SUSP
40.0000 mg | Freq: Once | INTRAMUSCULAR | Status: AC
  Administered 2022-10-17: 40 mg via INTRA_ARTICULAR

## 2022-10-17 NOTE — Progress Notes (Signed)
Chief Complaint  Patient presents with   Knee Pain    Right / wants injection has travel plans / wants xray before the MRI that is scheduled for next week    Moishe Spice is scheduled for MRI right knee she is going to the [employer] she is having medial knee pain along the pes tendons and bursa with swelling.  She tried ice, Aleve, Tylenol arthritis no relief  Examination reveals 51 year old female awake alert and oriented x 3  Right knee is tender over the pes bursa and tendons with some tenderness over the joint line where we are concerned about meniscus tear  Range of motion is normal stability strength test normal  X-ray shows arthritis of the knee grade 3  Injection pes bursa recommend ice and Aspercreme follow-up after MRI for results  Procedure note right knee injection for bursitis   verbal consent was obtained to inject right knee PES BURSA  Timeout was completed to confirm the site of injection  The medications used were 40 mg of Depo-Medrol and 1% lidocaine 3 cc  Anesthesia was provided by ethyl chloride and the skin was prepped with alcohol.  After cleaning the skin with alcohol a 25-gauge needle was used to inject the right knee bursa.  There were no complications and a sterile bandage was applied

## 2022-10-18 ENCOUNTER — Encounter (HOSPITAL_COMMUNITY): Payer: Self-pay

## 2022-10-18 NOTE — Therapy (Signed)
PHYSICAL THERAPY DISCHARGE SUMMARY  Visits from Start of Care: 1  Current functional level related to goals / functional outcomes: na   Remaining deficits: na   Education / Equipment: na   Patient agrees to discharge. Patient goals were not met. Patient is being discharged due to not returning since the last visit.  

## 2022-10-27 ENCOUNTER — Ambulatory Visit
Admission: RE | Admit: 2022-10-27 | Discharge: 2022-10-27 | Disposition: A | Discharge status: Home / Self Care | Payer: 59 | Source: Ambulatory Visit | Attending: Orthopedic Surgery | Admitting: Orthopedic Surgery

## 2022-10-27 DIAGNOSIS — S83241A Other tear of medial meniscus, current injury, right knee, initial encounter: Secondary | ICD-10-CM

## 2022-10-31 ENCOUNTER — Encounter: Payer: Self-pay | Admitting: Orthopedic Surgery

## 2022-10-31 ENCOUNTER — Ambulatory Visit (INDEPENDENT_AMBULATORY_CARE_PROVIDER_SITE_OTHER): Payer: 59 | Admitting: Orthopedic Surgery

## 2022-10-31 DIAGNOSIS — S83241A Other tear of medial meniscus, current injury, right knee, initial encounter: Secondary | ICD-10-CM | POA: Diagnosis not present

## 2022-10-31 DIAGNOSIS — Z01818 Encounter for other preprocedural examination: Secondary | ICD-10-CM

## 2022-10-31 NOTE — Patient Instructions (Addendum)
Your surgery will be at [facility] by Dr Harrison  The hospital will contact you with a preoperative appointment to discuss Anesthesia.  Please arrive on time or 15 minutes early for the preoperative appointment, they have a very tight schedule if you are late or do not come in your surgery will be cancelled.  The phone number is [phone]. Please bring your medications with you for the appointment. They will tell you the arrival time and medication instructions when you have your preoperative evaluation. Do not wear nail polish the day of your surgery and if you take Phentermine you need to stop this medication ONE WEEK prior to your surgery. If you take Invokana, Farxiga, Jardiance, or Steglatro) - Hold 72 hours before the procedure.  If you take Ozempic,  Bydureon or Trulicity do not take for 8 days before your surgery. If you take Victoza, Rybelsis, Saxenda or Adlyxi stop 24 hours before the procedure.  Please arrive at the hospital 2 hours before procedure if scheduled at 9:30 or later in the day or at the time the nurse tells you at your preoperative visit.   If you have my chart do not use the time given in my chart use the time given to you by the nurse during your preoperative visit.   Your surgery  time may change. Please be available for phone calls the day of your surgery and the day before. The Short Stay department may need to discuss changes about your surgery time. Not reaching the you could lead to procedure delays and possible cancellation.  You must have a ride home and someone to stay with you for 24 to 48 hours. The person taking you home will receive and sign for the your discharge instructions.  Please be prepared to give your support person's name and telephone number to [facility]. Dr Harrison will need that name and phone number post procedure.    Meniscus Injury, Arthroscopy   Arthroscopy is a surgical procedure that involves the use of a small scope that  has a camera and surgical instruments on the end (arthroscope). An arthroscope can be used to repair your meniscus injury.  LET YOUR HEALTH CARE PROVIDER KNOW ABOUT: Any allergies you have. All medicines you are taking, including vitamins, herbs, eyedrops, creams, and over-the-counter medicines. Any recent colds or infections you have had or currently have. Previous problems you or members of your family have had with the use of anesthetics. Any blood disorders or blood clotting problems you have. Previous surgeries you have had. Medical conditions you have. RISKS AND COMPLICATIONS Generally, this is a safe procedure. However, as with any procedure, problems can occur. Possible problems include: Damage to nerves or blood vessels. Excess bleeding. Blood clots. Infection. BEFORE THE PROCEDURE Do not eat or drink for 6-8 hours before the procedure. Take medicines as directed by your surgeon. Ask your surgeon about changing or stopping your regular medicines. You may have lab tests the morning of surgery. PROCEDURE  You will be given one of the following:  A medicine that numbs the area (local anesthesia). A medicine that makes you go to sleep (general anesthesia). A medicine injected into your spine that numbs your body below the waist (spinal anesthesia). Most often, several small cuts (incisions) are made in the knee. The arthroscope and instruments go into the incisions to repair the damage. The torn portion of the meniscus is removed.   AFTER THE PROCEDURE You will be taken to the recovery   area where your progress will be monitored. When you are awake, stable, and taking fluids without complications, you will be allowed to go home. This is usually the same day. A torn or stretched ligament (ligament sprain) may take 6-8 weeks to heal.  It takes about the 4-6 WEEKS if your surgeon removed a torn meniscus. A repaired meniscus may require 6-12 weeks of recovery time. A torn ligament  needing reconstructive surgery may take 6-12 months to heal fully.   This information is not intended to replace advice given to you by your health care provider. Make sure you discuss any questions you have with your health care provider. You have decided to proceed with operative arthroscopy of the knee. You have decided not to continue with nonoperative measures such as but not limited to oral medication, weight loss, activity modification, physical therapy, bracing, or injection.  We will perform operative arthroscopy of the knee. Some of the risks associated with arthroscopic surgery of the knee include but are not limited to Bleeding Infection Swelling Stiffness Blood clot Pain Need for knee replacement surgery    In compliance with recent [place] law in federal regulation regarding opioid use and abuse and addiction, we will taper (stop) opioid medication after 2 weeks.  If you're not comfortable with these risks and would like to continue with nonoperative treatment please let Dr. Harrison know prior to your surgery.  

## 2022-10-31 NOTE — Progress Notes (Signed)
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  After discussion she has decided to proceed with arthroscopic surgery of the right knee and partial medial meniscectomy  We reviewed the natural history of meniscectomy and an arthritic compartment and she still wants to proceed with the surgery which we will be arranged at her convenience  Encounter Diagnoses  Name Primary?   Pre-op exam    Acute medial meniscus tear of right knee, initial encounter Yes

## 2022-11-19 NOTE — Addendum Note (Signed)
Addended byCandice Camp on: 11/19/2022 10:40 AM   Modules accepted: Orders

## 2022-12-13 ENCOUNTER — Ambulatory Visit: Payer: 59 | Admitting: Family Medicine

## 2022-12-16 ENCOUNTER — Ambulatory Visit: Payer: 59 | Admitting: Family Medicine

## 2022-12-19 ENCOUNTER — Telehealth: Payer: Self-pay | Admitting: Radiology

## 2022-12-19 NOTE — Telephone Encounter (Signed)
-----   Message from Josue Hector sent at 12/19/2022 11:12 AM EST ----- This pt is on for 2/27, I have left multiple messages to schedule PAT.  Do you want to get someone there to see if they can get her to call me?  Thanks,

## 2022-12-19 NOTE — Telephone Encounter (Signed)
I spoke to patient she has had some insurance changes and wants to cancel surgery for now, I sent message to [facility]

## 2022-12-19 NOTE — Telephone Encounter (Signed)
I will call patient and send mychart message

## 2022-12-24 ENCOUNTER — Ambulatory Visit (HOSPITAL_COMMUNITY): Admission: RE | Admit: 2022-12-24 | Payer: 59 | Source: Home / Self Care | Admitting: Orthopedic Surgery

## 2022-12-24 ENCOUNTER — Encounter (HOSPITAL_COMMUNITY): Admission: RE | Payer: Self-pay | Source: Home / Self Care

## 2022-12-24 SURGERY — ARTHROSCOPY, KNEE, WITH MEDIAL MENISCECTOMY
Anesthesia: Choice | Site: Knee | Laterality: Right

## 2022-12-26 ENCOUNTER — Encounter: Payer: Self-pay | Admitting: Radiology

## 2023-05-05 ENCOUNTER — Other Ambulatory Visit: Payer: Self-pay

## 2023-05-05 MED ORDER — GABAPENTIN 300 MG PO CAPS: 300.0000 mg | ORAL_CAPSULE | Freq: Every day | ORAL | 3 refills | Status: DC

## 2023-05-07 NOTE — Progress Notes (Deleted)
PATIENT: Beverly Mclaughlin DOB: 07-Jun-1971  REASON FOR VISIT: follow up HISTORY FROM: patient  No chief complaint on file.    HISTORY OF PRESENT ILLNESS:  05/07/23 ALL: Beverly Mclaughlin returns for follow up for right sided facial pain. She was last seen 04/2022. We restarted gabapentin and prednisone taper. Since,   05/07/2022 ALL: Beverly Mclaughlin returns for follow up for right sided facial pain. She was last seen 09/2019. Gabapentin was continued at 600mg  TID and carbamazepine added. She reports taking meds for a few months but then discontinued as pain was resolved. She was doing well until three weeks ago. She reports pain returned over the right cheek. She reports pain radiates to the right eye and is sharp in nature. Pain last about 40 seconds then resolves. She has difficulty chewing when pain is present. Face is sensitive when she is washing her face. No drainage of eye or nose. No edema or significant tenderness of temporal region.   10/14/2019 ALL:  Beverly Mclaughlin is a 52 y.o. female here today for follow up of right sided face pain. She was seen in 04/2017 as a new patient. She was started on gabapentin and reports this worked very well. She discontinued gabapentin about 12-18 months ago. In October, 2020, she had her teeth cleaned. She has had significant worsening of right jaw pain. It starts at right lower lip and radiates to her right ear. She saw her PCP for jaw and shoulder pain. She was started on gabapentin 600mg  TID and Tramadol 50mg  at night. She did not get any relief and has increased dose.  She is now taking 1800mg  TID (total 5400mg  daily). She does feel that she can get through the day at this dose but pain continues. She denies adverse effects with increased dose. She denies history of liver or kidney disease. MRI in 2018 showed white matter changes and T2 flair hyper intensities. Additional testing not consistent with MS. She had a repeat MRI in 2019 that did not show any changes.    HISTORY: (copied from Dr Anne Hahn' note on 04/29/2017)  Beverly Mclaughlin is a 52 year old right-handed black female with a history of onset of right mid face discomfort that began in March 2018. The patient had just had a filling done on a tooth in the right maxillary area a few days prior to onset of symptoms. The patient has noted some sharp jabbing pains ago from the right upper lip to the orbital area on the right. The patient indicates that chewing or talking may increase the frequency of the pain. The patient was seen back by her dentist and the tooth was filed down slightly without benefit, the patient then had an evaluation through an oral surgeon and her wisdom teeth were removed along with another molar but the pain continued. The patient was seen by an endodontist, no dental abnormalities were noted. The patient was set up for a CT of the maxillofacial area which was unremarkable. The patient denies any numbness of the face, she has not had any weakness or numbness of extremities. At times she may have some slight imbalance, she denies issues controlling the bowels or the bladder. She has been placed on low-dose gabapentin taking 200 mg daily, this may have started to benefit the pain slightly. She is sent to this office for further evaluation.   REVIEW OF SYSTEMS: Out of a complete 14 system review of symptoms, the patient complains only of the following symptoms, right sided facial pain and all other  reviewed systems are negative.  ALLERGIES: Allergies  Allergen Reactions   Prilosec [Omeprazole] Hives and Rash    HOME MEDICATIONS: Outpatient Medications Prior to Visit  Medication Sig Dispense Refill   APPLE CIDER VINEGAR PO Take by mouth. 2 tablets once a day     gabapentin (NEURONTIN) 300 MG capsule Take 1 capsule (300 mg total) by mouth at bedtime. 90 capsule 3   ibuprofen (ADVIL) 800 MG tablet Take 1 tablet (800 mg total) by mouth every 8 (eight) hours as needed. 90 tablet 1    methocarbamol (ROBAXIN) 500 MG tablet Take 1 tablet (500 mg total) by mouth 3 (three) times daily. 60 tablet 1   Vitamin D, Ergocalciferol, (DRISDOL) 1.25 MG (50000 UNIT) CAPS capsule Take 1 capsule (50,000 Units total) by mouth every 7 (seven) days. 5 capsule 2   No facility-administered medications prior to visit.    PAST MEDICAL HISTORY: Past Medical History:  Diagnosis Date   Anemia    Facial pain    Trigeminal neuralgia 04/29/2017    Right V2    PAST SURGICAL HISTORY: Past Surgical History:  Procedure Laterality Date   ABDOMINAL HYSTERECTOMY  Jan 2017   -[facility]    TUBAL LIGATION      FAMILY HISTORY: Family History  Problem Relation Age of Onset   Heart disease Mother    Diabetes Mother    Multiple myeloma Mother    Kidney disease Mother    Diabetes Father    Kidney disease Father    Hypertension Sister    Diabetes Sister    Kidney disease Maternal Uncle    Cancer Maternal Grandmother    Kidney disease Maternal Grandmother    Breast cancer Maternal Grandmother    Diabetes Paternal Grandfather     SOCIAL HISTORY: Social History   Socioeconomic History   Marital status: Divorced    Spouse name: Not on file   Number of children: 2   Years of education: some college   Highest education level: Not on file  Occupational History   Occupation: Sports coach  Tobacco Use   Smoking status: Never   Smokeless tobacco: Never  Vaping Use   Vaping Use: Never used  Substance and Sexual Activity   Alcohol use: Yes    Alcohol/week: 2.0 standard drinks of alcohol    Types: 1 Glasses of wine, 1 Cans of beer per week    Comment: occasionally   Drug use: No   Sexual activity: Yes    Birth control/protection: Surgical  Other Topics Concern   Not on file  Social History Narrative   Lives at home with two sons.   Right-handed.   2 cups coffee daily.   Social Determinants of Health   Financial Resource Strain: Not on file  Food Insecurity: Not on  file  Transportation Needs: Not on file  Physical Activity: Not on file  Stress: Not on file  Social Connections: Not on file  Intimate Partner Violence: Not on file      PHYSICAL EXAM  There were no vitals filed for this visit.   There is no height or weight on file to calculate BMI.  Generalized: Well developed, in no acute distress  Cardiology: normal rate and rhythm, no murmur noted Respiratory: clear to auscultation bilaterally  Neurological examination  Mentation: Alert oriented to time, place, history taking. Follows all commands speech and language fluent Cranial nerve II-XII: Pupils were equal round reactive to light. Extraocular movements were full, visual field were full  on confrontational test. Facial sensation and strength were normal. Uvula tongue midline. Head turning and shoulder shrug  were normal and symmetric. Motor: The motor testing reveals 5 over 5 strength of all 4 extremities. Good symmetric motor tone is noted throughout.  Sensory: Sensory testing is intact to soft touch on all 4 extremities. No evidence of extinction is noted. Tenderness with palpation of right lower jaw line. No numbness  Gait and station: Gait is normal.   DIAGNOSTIC DATA (LABS, IMAGING, TESTING) - I reviewed patient records, labs, notes, testing and imaging myself where available.      No data to display           Lab Results  Component Value Date   WBC 7.3 09/13/2022   HGB 12.0 09/13/2022   HCT 35.6 09/13/2022   MCV 93 09/13/2022   PLT 198 09/13/2022      Component Value Date/Time   NA 138 09/13/2022 0807   K 4.3 09/13/2022 0807   CL 102 09/13/2022 0807   CO2 23 09/13/2022 0807   GLUCOSE 86 09/13/2022 0807   GLUCOSE 84 12/08/2020 0944   BUN 10 09/13/2022 0807   CREATININE 0.81 09/13/2022 0807   CREATININE 0.82 12/08/2020 0944   CALCIUM 9.4 09/13/2022 0807   PROT 6.7 09/13/2022 0807   ALBUMIN 4.1 09/13/2022 0807   AST 17 09/13/2022 0807   ALT 11 09/13/2022 0807    ALKPHOS 54 09/13/2022 0807   BILITOT 0.4 09/13/2022 0807   GFRNONAA 85 04/29/2017 1035   GFRNONAA >89 05/31/2015 0830   GFRAA 99 04/29/2017 1035   GFRAA >89 05/31/2015 0830   Lab Results  Component Value Date   CHOL 150 09/13/2022   HDL 74 09/13/2022   LDLCALC 64 09/13/2022   TRIG 59 09/13/2022   CHOLHDL 2.0 09/13/2022   Lab Results  Component Value Date   HGBA1C 5.1 09/13/2022   No results found for: "VITAMINB12" Lab Results  Component Value Date   TSH 1.100 09/13/2022     ASSESSMENT AND PLAN 52 y.o. year old female  has a past medical history of Anemia, Facial pain, and Trigeminal neuralgia (04/29/2017). here with   No diagnosis found.    Harly was doing very well until three weeks ago. I will start prednisone taper and gabapentin 300mg  daily at bedtime as needed. Avoidance of triggers discussed. She will call with any worsening or unresolved symptoms. She will follow-up with me in 1 year, sooner if needed.  She verbalizes understanding and agreement with this plan.   No orders of the defined types were placed in this encounter.    No orders of the defined types were placed in this encounter.     Shawnie Dapper, FNP-C 05/07/2023, 4:07 PM [facility] [address], [address] [place], [place] [address] [phone]

## 2023-05-08 ENCOUNTER — Encounter: Payer: Self-pay | Admitting: Family Medicine

## 2023-05-08 ENCOUNTER — Ambulatory Visit: Payer: Managed Care, Other (non HMO) | Admitting: Family Medicine

## 2023-05-08 DIAGNOSIS — G500 Trigeminal neuralgia: Secondary | ICD-10-CM

## 2023-06-25 ENCOUNTER — Ambulatory Visit: Payer: Managed Care, Other (non HMO) | Admitting: Family Medicine

## 2023-07-01 NOTE — Patient Instructions (Signed)
Below is our plan:  We will increase gabapentin to 300mg  up to three times daily. Start prednisone taper as directed. We can consider adding oxcarbazepine later if needed.   Please make sure you are staying well hydrated. I recommend 50-60 ounces daily. Well balanced diet and regular exercise encouraged. Consistent sleep schedule with 6-8 hours recommended.   Please continue follow up with care team as directed.   Follow up with me in 1 year, sooner if needed   You may receive a survey regarding today's visit. I encourage you to leave honest feed back as I do use this information to improve patient care. Thank you for seeing me today!

## 2023-07-01 NOTE — Progress Notes (Signed)
PATIENT: Beverly Mclaughlin DOB: 17-Jul-1971  REASON FOR VISIT: follow up HISTORY FROM: patient  Chief Complaint  Patient presents with   Room 2    Pt is here Alone. Pt states that she is in intense pain. Pt states that her pain gets so bad that her face hurts.      HISTORY OF PRESENT ILLNESS:  07/02/23 ALL: Beverly Mclaughlin returns for follow up for TN. She was last seen 04/2022. She reported discontinuing gabapentin in 2020 as pain resolved but had a recent flare. Steroid taper prescribed and gabapentin was restarted at 300mg  at bedtime. Since, she reports doing well until about a month ago. Sharp, stabbing, electrical pain radiating from right temporal area to right cheek. Worse with chewing or applying makeup. Heat seems to help. She increased gabapentin to 300mg  BID that did seem to help. No drainage of eye or nose. No edema or significant tenderness of temporal region.  05/07/2022 ALL: Beverly Mclaughlin returns for follow up for right sided facial pain. She was last seen 09/2019. Gabapentin was continued at 600mg  TID and carbamazepine added. She reports taking meds for a few months but then discontinued as pain was resolved. She was doing well until three weeks ago. She reports pain returned over the right cheek. She reports pain radiates to the right eye and is sharp in nature. Pain last about 40 seconds then resolves. She has difficulty chewing when pain is present. Face is sensitive when she is washing her face. No drainage of eye or nose. No edema or significant tenderness of temporal region.   10/14/2019 ALL:  Beverly Mclaughlin is a 52 y.o. female here today for follow up of right sided face pain. She was seen in 04/2017 as a new patient. She was started on gabapentin and reports this worked very well. She discontinued gabapentin about 12-18 months ago. In October, 2020, she had her teeth cleaned. She has had significant worsening of right jaw pain. It starts at right lower lip and radiates to her right ear. She  saw her PCP for jaw and shoulder pain. She was started on gabapentin 600mg  TID and Tramadol 50mg  at night. She did not get any relief and has increased dose.  She is now taking 1800mg  TID (total 5400mg  daily). She does feel that she can get through the day at this dose but pain continues. She denies adverse effects with increased dose. She denies history of liver or kidney disease. MRI in 2018 showed white matter changes and T2 flair hyper intensities. Additional testing not consistent with MS. She had a repeat MRI in 2019 that did not show any changes.   HISTORY: (copied from Dr Beverly Mclaughlin' note on 04/29/2017)  Beverly Mclaughlin is a 52 year old right-handed black female with a history of onset of right mid face discomfort that began in March 2018. The patient had just had a filling done on a tooth in the right maxillary area a few days prior to onset of symptoms. The patient has noted some sharp jabbing pains ago from the right upper lip to the orbital area on the right. The patient indicates that chewing or talking may increase the frequency of the pain. The patient was seen back by her dentist and the tooth was filed down slightly without benefit, the patient then had an evaluation through an oral surgeon and her wisdom teeth were removed along with another molar but the pain continued. The patient was seen by an endodontist, no dental abnormalities were noted. The patient was  set up for a CT of the maxillofacial area which was unremarkable. The patient denies any numbness of the face, she has not had any weakness or numbness of extremities. At times she may have some slight imbalance, she denies issues controlling the bowels or the bladder. She has been placed on low-dose gabapentin taking 200 mg daily, this may have started to benefit the pain slightly. She is sent to this office for further evaluation.   REVIEW OF SYSTEMS: Out of a complete 14 system review of symptoms, the patient complains only of the following  symptoms, right sided facial pain and all other reviewed systems are negative.  ALLERGIES: Allergies  Allergen Reactions   Prilosec [Omeprazole] Hives and Rash    HOME MEDICATIONS: Outpatient Medications Prior to Visit  Medication Sig Dispense Refill   gabapentin (NEURONTIN) 300 MG capsule Take 1 capsule (300 mg total) by mouth at bedtime. 90 capsule 3   APPLE CIDER VINEGAR PO Take by mouth. 2 tablets once a day (Patient not taking: Reported on 07/02/2023)     ibuprofen (ADVIL) 800 MG tablet Take 1 tablet (800 mg total) by mouth every 8 (eight) hours as needed. (Patient not taking: Reported on 07/02/2023) 90 tablet 1   methocarbamol (ROBAXIN) 500 MG tablet Take 1 tablet (500 mg total) by mouth 3 (three) times daily. (Patient not taking: Reported on 07/02/2023) 60 tablet 1   Vitamin D, Ergocalciferol, (DRISDOL) 1.25 MG (50000 UNIT) CAPS capsule Take 1 capsule (50,000 Units total) by mouth every 7 (seven) days. (Patient not taking: Reported on 07/02/2023) 5 capsule 2   No facility-administered medications prior to visit.    PAST MEDICAL HISTORY: Past Medical History:  Diagnosis Date   Anemia    Facial pain    Trigeminal neuralgia 04/29/2017    Right V2    PAST SURGICAL HISTORY: Past Surgical History:  Procedure Laterality Date   ABDOMINAL HYSTERECTOMY  Jan 2017   -[facility]    TUBAL LIGATION      FAMILY HISTORY: Family History  Problem Relation Age of Onset   Heart disease Mother    Diabetes Mother    Multiple myeloma Mother    Kidney disease Mother    Diabetes Father    Kidney disease Father    Hypertension Sister    Diabetes Sister    Kidney disease Maternal Uncle    Cancer Maternal Grandmother    Kidney disease Maternal Grandmother    Breast cancer Maternal Grandmother    Diabetes Paternal Grandfather     SOCIAL HISTORY: Social History   Socioeconomic History   Marital status: Divorced    Spouse name: Not on file   Number of children: 2   Years of education:  some college   Highest education level: Not on file  Occupational History   Occupation: Sports coach  Tobacco Use   Smoking status: Never   Smokeless tobacco: Never  Vaping Use   Vaping status: Never Used  Substance and Sexual Activity   Alcohol use: Yes    Alcohol/week: 2.0 standard drinks of alcohol    Types: 1 Glasses of wine, 1 Cans of beer per week    Comment: occasionally   Drug use: No   Sexual activity: Yes    Birth control/protection: Surgical  Other Topics Concern   Not on file  Social History Narrative   Lives at home with two sons.   Right-handed.   2 cups coffee daily.   Social Determinants of Health   Financial  Resource Strain: Not on file  Food Insecurity: Not on file  Transportation Needs: Not on file  Physical Activity: Not on file  Stress: Not on file  Social Connections: Not on file  Intimate Partner Violence: Not on file      PHYSICAL EXAM  Vitals:   07/02/23 0828  BP: (!) 127/92  Pulse: 94  Weight: 292 lb 8 oz (132.7 kg)  Height: 5\' 9"  (1.753 m)     Body mass index is 43.19 kg/m.  Generalized: Well developed, in no acute distress  Cardiology: normal rate and rhythm, no murmur noted Respiratory: clear to auscultation bilaterally  Neurological examination  Mentation: Alert oriented to time, place, history taking. Follows all commands speech and language fluent Cranial nerve II-XII: Pupils were equal round reactive to light. Extraocular movements were full, visual field were full on confrontational test. Facial sensation and strength were normal. Uvula tongue midline. Head turning and shoulder shrug  were normal and symmetric. Motor: The motor testing reveals 5 over 5 strength of all 4 extremities. Good symmetric motor tone is noted throughout.  Sensory: Sensory testing is intact to soft touch on all 4 extremities. No evidence of extinction is noted. Tenderness with palpation of right lower jaw line. No numbness  Gait and  station: Gait is normal.   DIAGNOSTIC DATA (LABS, IMAGING, TESTING) - I reviewed patient records, labs, notes, testing and imaging myself where available.      No data to display           Lab Results  Component Value Date   WBC 7.3 09/13/2022   HGB 12.0 09/13/2022   HCT 35.6 09/13/2022   MCV 93 09/13/2022   PLT 198 09/13/2022      Component Value Date/Time   NA 138 09/13/2022 0807   K 4.3 09/13/2022 0807   CL 102 09/13/2022 0807   CO2 23 09/13/2022 0807   GLUCOSE 86 09/13/2022 0807   GLUCOSE 84 12/08/2020 0944   BUN 10 09/13/2022 0807   CREATININE 0.81 09/13/2022 0807   CREATININE 0.82 12/08/2020 0944   CALCIUM 9.4 09/13/2022 0807   PROT 6.7 09/13/2022 0807   ALBUMIN 4.1 09/13/2022 0807   AST 17 09/13/2022 0807   ALT 11 09/13/2022 0807   ALKPHOS 54 09/13/2022 0807   BILITOT 0.4 09/13/2022 0807   GFRNONAA 85 04/29/2017 1035   GFRNONAA >89 05/31/2015 0830   GFRAA 99 04/29/2017 1035   GFRAA >89 05/31/2015 0830   Lab Results  Component Value Date   CHOL 150 09/13/2022   HDL 74 09/13/2022   LDLCALC 64 09/13/2022   TRIG 59 09/13/2022   CHOLHDL 2.0 09/13/2022   Lab Results  Component Value Date   HGBA1C 5.1 09/13/2022   No results found for: "VITAMINB12" Lab Results  Component Value Date   TSH 1.100 09/13/2022     ASSESSMENT AND PLAN 52 y.o. year old female  has a past medical history of Anemia, Facial pain, and Trigeminal neuralgia (04/29/2017). here with     ICD-10-CM   1. Trigeminal neuralgia  G50.0       Stanisha was doing very well until about a month ago. I will start prednisone taper and increase gabapentin to 300mg  up to three times daily as needed. May consider adding oxcarbazepine if needed. Avoidance of triggers discussed. She will call with any worsening or unresolved symptoms. She will follow-up with me in 1 year, sooner if needed.  She verbalizes understanding and agreement with this plan.   No  orders of the defined types were placed in this  encounter.    Meds ordered this encounter  Medications   predniSONE (STERAPRED UNI-PAK 21 TAB) 10 MG (21) TBPK tablet    Sig: Taper pack as directed    Dispense:  1 each    Refill:  0    Order Specific Question:   Supervising Provider    Answer:   Anson Fret [[id number]]   gabapentin (NEURONTIN) 300 MG capsule    Sig: Take 1 capsule (300 mg total) by mouth 3 (three) times daily as needed.    Dispense:  270 capsule    Refill:  3    Order Specific Question:   Supervising Provider    Answer:   Anson Fret [[id number]]      AOZ HYQMV, FNP-C 07/02/2023, 9:20 AM [facility] [address] [place], [place] [address] [phone]

## 2023-07-02 ENCOUNTER — Ambulatory Visit (INDEPENDENT_AMBULATORY_CARE_PROVIDER_SITE_OTHER): Payer: Managed Care, Other (non HMO) | Admitting: Family Medicine

## 2023-07-02 ENCOUNTER — Encounter: Payer: Self-pay | Admitting: Family Medicine

## 2023-07-02 VITALS — BP 127/92 | HR 94 | Ht 69.0 in | Wt 292.5 lb

## 2023-07-02 DIAGNOSIS — G500 Trigeminal neuralgia: Secondary | ICD-10-CM

## 2023-07-02 MED ORDER — GABAPENTIN 300 MG PO CAPS: 300.0000 mg | ORAL_CAPSULE | Freq: Three times a day (TID) | ORAL | 3 refills | Status: DC | PRN

## 2023-07-02 MED ORDER — PREDNISONE 10 MG (21) PO TBPK: ORAL_TABLET | ORAL | 0 refills | Status: DC

## 2023-07-10 ENCOUNTER — Encounter: Payer: Self-pay | Admitting: Family Medicine

## 2023-07-10 ENCOUNTER — Other Ambulatory Visit: Payer: Self-pay | Admitting: Family Medicine

## 2023-07-10 DIAGNOSIS — G500 Trigeminal neuralgia: Secondary | ICD-10-CM

## 2023-07-10 MED ORDER — OXCARBAZEPINE 150 MG PO TABS: 150.0000 mg | ORAL_TABLET | Freq: Two times a day (BID) | ORAL | 0 refills | Status: DC

## 2023-07-14 ENCOUNTER — Other Ambulatory Visit (INDEPENDENT_AMBULATORY_CARE_PROVIDER_SITE_OTHER): Payer: Self-pay

## 2023-07-14 DIAGNOSIS — Z0289 Encounter for other administrative examinations: Secondary | ICD-10-CM

## 2023-07-14 DIAGNOSIS — G500 Trigeminal neuralgia: Secondary | ICD-10-CM

## 2023-07-15 ENCOUNTER — Other Ambulatory Visit: Payer: Self-pay | Admitting: Family Medicine

## 2023-07-15 DIAGNOSIS — G500 Trigeminal neuralgia: Secondary | ICD-10-CM

## 2023-07-15 LAB — COMPREHENSIVE METABOLIC PANEL
ALT: 11 IU/L (ref 0–32)
AST: 12 IU/L (ref 0–40)
Albumin: 3.8 g/dL (ref 3.8–4.9)
Alkaline Phosphatase: 56 IU/L (ref 44–121)
BUN/Creatinine Ratio: 13 (ref 9–23)
BUN: 11 mg/dL (ref 6–24)
Bilirubin Total: <0.2 mg/dL (ref 0.0–1.2)
CO2: 27 mmol/L (ref 20–29)
Calcium: 9 mg/dL (ref 8.7–10.2)
Chloride: 101 mmol/L (ref 96–106)
Creatinine, Ser: 0.88 mg/dL (ref 0.57–1.00)
Globulin, Total: 3 g/dL (ref 1.5–4.5)
Glucose: 81 mg/dL (ref 70–99)
Potassium: 4.1 mmol/L (ref 3.5–5.2)
Sodium: 140 mmol/L (ref 134–144)
Total Protein: 6.8 g/dL (ref 6.0–8.5)
eGFR: 78 mL/min/{1.73_m2} (ref >59)

## 2023-07-22 ENCOUNTER — Encounter: Payer: Self-pay | Admitting: Family Medicine

## 2023-07-24 ENCOUNTER — Other Ambulatory Visit: Payer: Self-pay

## 2023-07-24 MED ORDER — OXCARBAZEPINE 150 MG PO TABS: 150.0000 mg | ORAL_TABLET | Freq: Two times a day (BID) | ORAL | 0 refills | Status: DC

## 2023-08-11 ENCOUNTER — Encounter: Payer: Self-pay | Admitting: Family Medicine

## 2023-08-11 DIAGNOSIS — G500 Trigeminal neuralgia: Secondary | ICD-10-CM

## 2023-08-13 MED ORDER — OXCARBAZEPINE 150 MG PO TABS: ORAL_TABLET | ORAL | 5 refills | Status: DC

## 2023-08-13 NOTE — Telephone Encounter (Signed)
Called pt. Relayed AL,NP message. She never was able to pick up carbamazepine rx sent in 07/24/23, stating too soon to refill. I reviewed rx sent 07/24/23. It was for 150mg  po BID and not 150mg  in am and 300mg  in pm. I sent in new rx reflecting this. She used what she had on hand taking 150mg  in the am and 300mg  at night, down to 1 pill.    I also released lab order for her to get sodium level checked in the next week. She plans to go to [facility] in [place].

## 2023-08-26 NOTE — Telephone Encounter (Signed)
Called pt at [phone]. She is still taking oxcarbazepine 150mg  in the am and 300mg  in the pm. She is having pain still but pain has improved. She plans to get labs by the end of the week. Wants to see what results are first. May want Amy Lomax,NP to increase dose if possible if labs look ok.

## 2023-09-03 ENCOUNTER — Other Ambulatory Visit: Payer: Self-pay | Admitting: *Deleted

## 2023-09-03 ENCOUNTER — Other Ambulatory Visit (INDEPENDENT_AMBULATORY_CARE_PROVIDER_SITE_OTHER): Payer: Self-pay

## 2023-09-03 DIAGNOSIS — G500 Trigeminal neuralgia: Secondary | ICD-10-CM

## 2023-09-03 DIAGNOSIS — Z0289 Encounter for other administrative examinations: Secondary | ICD-10-CM

## 2023-09-04 ENCOUNTER — Telehealth: Payer: Self-pay

## 2023-09-04 LAB — SODIUM: Sodium: 140 mmol/L (ref 134–144)

## 2023-09-04 MED ORDER — OXCARBAZEPINE 150 MG PO TABS: 300.0000 mg | ORAL_TABLET | Freq: Two times a day (BID) | ORAL | 3 refills | Status: DC

## 2023-09-04 NOTE — Telephone Encounter (Signed)
Called pharmacy and phone kept ringing until it went to voicemail.

## 2023-09-04 NOTE — Telephone Encounter (Signed)
Called [facility] at [phone]. Pharmacy closed for lunch until 2pm.

## 2023-09-04 NOTE — Addendum Note (Signed)
Addended byNicholas Lose, Natalie Leclaire L on: 09/04/2023 12:51 PM   Modules accepted: Orders

## 2023-10-07 ENCOUNTER — Other Ambulatory Visit: Payer: Self-pay | Admitting: Family Medicine

## 2023-10-07 MED ORDER — GABAPENTIN 300 MG PO CAPS: 600.0000 mg | ORAL_CAPSULE | Freq: Three times a day (TID) | ORAL | 3 refills | Status: DC

## 2023-10-08 NOTE — Telephone Encounter (Signed)
Pt called to check on next available. Informed pt of next available in February 2025. Pt said would call back to check next available appt.

## 2024-02-19 ENCOUNTER — Encounter: Payer: Self-pay | Admitting: *Deleted

## 2024-03-09 ENCOUNTER — Ambulatory Visit: Payer: Self-pay

## 2024-04-25 ENCOUNTER — Other Ambulatory Visit: Payer: Self-pay

## 2024-04-25 ENCOUNTER — Emergency Department (HOSPITAL_COMMUNITY)
Admission: EM | Admit: 2024-04-25 | Discharge: 2024-04-25 | Disposition: A | Discharge status: Home / Self Care | Attending: Emergency Medicine | Admitting: Emergency Medicine

## 2024-04-25 ENCOUNTER — Emergency Department (HOSPITAL_COMMUNITY)

## 2024-04-25 ENCOUNTER — Encounter (HOSPITAL_COMMUNITY): Payer: Self-pay | Admitting: Emergency Medicine

## 2024-04-25 DIAGNOSIS — D649 Anemia, unspecified: Secondary | ICD-10-CM | POA: Diagnosis not present

## 2024-04-25 DIAGNOSIS — R519 Headache, unspecified: Secondary | ICD-10-CM | POA: Insufficient documentation

## 2024-04-25 LAB — CBC WITH DIFFERENTIAL/PLATELET
Abs Immature Granulocytes: 0.02 10*3/uL (ref 0.00–0.07)
Basophils Absolute: 0.1 10*3/uL (ref 0.0–0.1)
Basophils Relative: 1 %
Eosinophils Absolute: 0 10*3/uL (ref 0.0–0.5)
Eosinophils Relative: 0 %
HCT: 36.8 % (ref 36.0–46.0)
Hemoglobin: 11.9 g/dL — ABNORMAL LOW (ref 12.0–15.0)
Immature Granulocytes: 0 %
Lymphocytes Relative: 22 %
Lymphs Abs: 1.6 10*3/uL (ref 0.7–4.0)
MCH: 32.5 pg (ref 26.0–34.0)
MCHC: 32.3 g/dL (ref 30.0–36.0)
MCV: 100.5 fL — ABNORMAL HIGH (ref 80.0–100.0)
Monocytes Absolute: 0.4 10*3/uL (ref 0.1–1.0)
Monocytes Relative: 6 %
Neutro Abs: 5.2 10*3/uL (ref 1.7–7.7)
Neutrophils Relative %: 71 %
Platelets: 303 10*3/uL (ref 150–400)
RBC: 3.66 MIL/uL — ABNORMAL LOW (ref 3.87–5.11)
RDW: 12.7 % (ref 11.5–15.5)
WBC: 7.4 10*3/uL (ref 4.0–10.5)
nRBC: 0 % (ref 0.0–0.2)

## 2024-04-25 LAB — COMPREHENSIVE METABOLIC PANEL WITH GFR
ALT: 13 U/L (ref 0–44)
AST: 16 U/L (ref 15–41)
Albumin: 3.7 g/dL (ref 3.5–5.0)
Alkaline Phosphatase: 51 U/L (ref 38–126)
Anion gap: 11 (ref 5–15)
BUN: 11 mg/dL (ref 6–20)
CO2: 25 mmol/L (ref 22–32)
Calcium: 9.3 mg/dL (ref 8.9–10.3)
Chloride: 103 mmol/L (ref 98–111)
Creatinine, Ser: 0.8 mg/dL (ref 0.44–1.00)
GFR, Estimated: >60 mL/min (ref >60)
Glucose, Bld: 100 mg/dL — ABNORMAL HIGH (ref 70–99)
Potassium: 4 mmol/L (ref 3.5–5.1)
Sodium: 139 mmol/L (ref 135–145)
Total Bilirubin: 0.4 mg/dL (ref 0.0–1.2)
Total Protein: 7.1 g/dL (ref 6.5–8.1)

## 2024-04-25 MED ORDER — IOHEXOL 300 MG/ML  SOLN
75.0000 mL | Freq: Once | INTRAMUSCULAR | Status: AC | PRN
  Administered 2024-04-25: 75 mL via INTRAVENOUS

## 2024-04-25 MED ORDER — OXYCODONE-ACETAMINOPHEN 5-325 MG PO TABS: 1.0000 | ORAL_TABLET | Freq: Four times a day (QID) | ORAL | 0 refills | Status: AC | PRN

## 2024-04-25 MED ORDER — FENTANYL CITRATE PF 50 MCG/ML IJ SOSY
50.0000 ug | PREFILLED_SYRINGE | Freq: Once | INTRAMUSCULAR | Status: AC
  Administered 2024-04-25: 50 ug via INTRAVENOUS
  Filled 2024-04-25: qty 1

## 2024-04-25 NOTE — ED Triage Notes (Signed)
 Pt c/o severe right lower facial/jaw pain related to known dx trigeminal neuralgia with pain radiating from lower jaw up along right ear to forehead. Pt's face is swollen and she is tearful in triage. She has been taking gabapentin  and tegretol  with no relief. Symptoms started on Thursday and she thought it may be dental-related, so she called her dr and received rx for amoxicillin . She has taken 4 doses so far with no improvement, and swelling and pain have continued to worsen. Pt states she usually does not have swelling with neuralgia flares.

## 2024-04-25 NOTE — Discharge Instructions (Signed)
 You were seen in the emergency department for possible flare of your trigeminal neuralgia.  You had a lab work and a CAT scan of your face that did not show any obvious sign of abscess or other explanation for your symptoms.  Please continue regular medications and we are adding some oxycodone for pain.  Reach out to your neurologist tomorrow for close follow-up.  Return if any worsening or concerning symptoms

## 2024-04-25 NOTE — ED Provider Notes (Signed)
 [facility] AT [facility] Provider Note   CSN: [id number] Arrival date & time: 04/25/24  1719     Patient presents with: Facial Pain   Beverly Mclaughlin is a 53 y.o. female.  She has a history of trigeminal neuralgia and follows with [facility] neurology.  She had a flareup of pain on the right side of her face that she thinks may be her trigeminal neuralgia.  It is mostly in her cheek and jaw.  Not responsive to her regular medications.  No significant dental pain.  She does feel like her face is little swollen though.  No fevers.  PCP called her in some antibiotics for possible dental infection.  Has not changed any of her symptoms.  Rates the pain as severe.  {Add pertinent medical, surgical, social history, OB history to YEP:67052} The history is provided by the patient.       Prior to Admission medications   Medication Sig Start Date End Date Taking? Authorizing Provider  gabapentin  (NEURONTIN ) 300 MG capsule Take 2 capsules (600 mg total) by mouth 3 (three) times daily. 10/07/23   Lomax, Amy, NP  OXcarbazepine  (TRILEPTAL ) 150 MG tablet Take 2 tablets (300 mg total) by mouth 2 (two) times daily. 09/04/23   Lomax, Amy, NP  predniSONE  (STERAPRED UNI-PAK 21 TAB) 10 MG (21) TBPK tablet Taper pack as directed 07/02/23   Lomax, Amy, NP    Allergies: Prilosec [omeprazole]    Review of Systems  Constitutional:  Negative for fever.  HENT:  Positive for facial swelling. Negative for dental problem and sore throat.   Respiratory:  Negative for cough.   Cardiovascular:  Negative for chest pain.    Updated Vital Signs BP (!) 142/96 (BP Location: Right Arm)   Pulse 82   Temp 97.8 F (36.6 C)   Resp 17   Ht 5' 8 (1.727 m)   Wt 121.1 kg   LMP 11/15/2015   SpO2 99%   BMI 40.60 kg/m   Physical Exam Vitals and nursing note reviewed.  Constitutional:      General: She is not in acute distress.    Appearance: Normal appearance. She is well-developed.   HENT:     Head: Normocephalic and atraumatic.     Comments: She has pain throughout her right cheek and jaw area.  No trismus no malocclusion.  No gross signs of abscess and no induration.    Nose: Nose normal.     Mouth/Throat:     Mouth: Mucous membranes are moist.     Pharynx: Oropharynx is clear. No oropharyngeal exudate or posterior oropharyngeal erythema.   Eyes:     Conjunctiva/sclera: Conjunctivae normal.    Cardiovascular:     Rate and Rhythm: Normal rate and regular rhythm.     Heart sounds: No murmur heard. Pulmonary:     Effort: Pulmonary effort is normal. No respiratory distress.     Breath sounds: Normal breath sounds.  Abdominal:     Palpations: Abdomen is soft.     Tenderness: There is no abdominal tenderness.   Musculoskeletal:        General: Normal range of motion.     Cervical back: Neck supple. No tenderness.   Skin:    General: Skin is warm and dry.   Neurological:     General: No focal deficit present.     Mental Status: She is alert.     GCS: GCS eye subscore is 4. GCS verbal subscore is 5. GCS  motor subscore is 6.     (all labs ordered are listed, but only abnormal results are displayed) Labs Reviewed  CBC WITH DIFFERENTIAL/PLATELET - Abnormal; Notable for the following components:      Result Value   RBC 3.66 (*)    Hemoglobin 11.9 (*)    MCV 100.5 (*)    All other components within normal limits  COMPREHENSIVE METABOLIC PANEL WITH GFR - Abnormal; Notable for the following components:   Glucose, Bld 100 (*)    All other components within normal limits    EKG: None  Radiology: No results found.  {Document cardiac monitor, telemetry assessment procedure when appropriate:32947} Procedures   Medications Ordered in the ED  fentaNYL (SUBLIMAZE) injection 50 mcg (50 mcg Intravenous Given 04/25/24 1803)      {Click here for ABCD2, HEART and other calculators REFRESH Note before signing:1}                              Medical Decision  Making Amount and/or Complexity of Data Reviewed Labs: ordered. Radiology: ordered.  Risk Prescription drug management.   This patient complains of ***; this involves an extensive number of treatment Options and is a complaint that carries with it a high risk of complications and morbidity. The differential includes ***  I ordered, reviewed and interpreted labs, which included *** I ordered medication *** and reviewed PMP when indicated. I ordered imaging studies which included *** and I independently    visualized and interpreted imaging which showed *** Additional history obtained from *** Previous records obtained and reviewed *** I consulted *** and discussed lab and imaging findings and discussed disposition.  Cardiac monitoring reviewed, *** Social determinants considered, *** Critical Interventions: ***  After the interventions stated above, I reevaluated the patient and found *** Admission and further testing considered, ***   {Document critical care time when appropriate  Document review of labs and clinical decision tools ie CHADS2VASC2, etc  Document your independent review of radiology images and any outside records  Document your discussion with family members, caretakers and with consultants  Document social determinants of health affecting pt's care  Document your decision making why or why not admission, treatments were needed:32947:::1}   Final diagnoses:  None    ED Discharge Orders     None

## 2024-04-26 ENCOUNTER — Telehealth: Payer: Self-pay | Admitting: Family Medicine

## 2024-04-26 MED FILL — Oxycodone w/ Acetaminophen Tab 5-325 MG: ORAL | Qty: 6 | Status: AC

## 2024-04-26 NOTE — Telephone Encounter (Addendum)
 Reviewed chart. Was seen at [facility] ER yesterday. Last saw AL,NP 07/02/23. Previously seen by Dr. Jenel. Has not seen another provider at office yet. Amy is out. Per last notes, she was to f/u around 07/01/24. Has no appt currently scheduled.   Also found previous message:     Per Gerry/billing: she owes us  $296.02 (2023 & 2024 OVs). She will need to pay towards balance before we can get her scheduled for an appointment.  I called pt. Relayed message. She was willing to work with billing first. I transferred to Gem. Waiting on this to be resolved and then will get appt scheduled with pt.

## 2024-04-26 NOTE — Telephone Encounter (Signed)
 Per Courtenay, she paid $100 towards balance. Spoke w/ pt and scheduled appt for 04/27/24 at 11:30a, check in 11:00am.

## 2024-04-26 NOTE — Telephone Encounter (Signed)
 Patient went to ED with facial pain, was prescribe oxycodone. Today still having severe facial pain. ED administer Percocet, took 1 tablet this morning. Have not picked up the for Oxycodone yet. Would like a work in appointment to be seen if possible.

## 2024-04-27 ENCOUNTER — Encounter: Payer: Self-pay | Admitting: Family Medicine

## 2024-04-27 ENCOUNTER — Ambulatory Visit: Admitting: Neurology

## 2024-05-18 ENCOUNTER — Encounter: Payer: Self-pay | Admitting: Neurology

## 2024-05-18 ENCOUNTER — Ambulatory Visit: Payer: Self-pay | Admitting: Neurology

## 2024-05-18 VITALS — BP 126/76 | HR 75 | Ht 68.0 in | Wt 275.0 lb

## 2024-05-18 DIAGNOSIS — R9082 White matter disease, unspecified: Secondary | ICD-10-CM | POA: Diagnosis not present

## 2024-05-18 DIAGNOSIS — R519 Headache, unspecified: Secondary | ICD-10-CM

## 2024-05-18 DIAGNOSIS — G500 Trigeminal neuralgia: Secondary | ICD-10-CM

## 2024-05-18 NOTE — Progress Notes (Signed)
 HISTORY OF PRESENT ILLNESS:   05/18/2024 RAS: She has right sided trigeminal neuralgia since 2018.   Pain comes as jolts sometimes in trains.   Brushing teeth and sometimes eating can exacerbate. She has flare-ups lasting a couple weeks to  a couple months 1-3 times  year.   She has done worse the past 2 months.   Pain is mostly in the V3 distribution.  She denies numbness  She is currently on oxcarbazepine  300 mg po bid and gabapentin  300-300-600 mg over the day.  Prednisone  packs had not helped in the past.   She has not tried baclofen or lamotrigine   I personally reviewed the MRI of the brain from 06/18/2018.  The brainstem was normal.  No abnormality of the dorsal root entry zones.  The trigeminal nerves appeared normal.  No distortion by vascular structures noted.   She has many white matter foci (more than expected) but they are in chronic microvascular pattern (subcortical and deep WM).     CSF 06/02/2017 did not show OCB  07/02/23 ALL: Beverly Mclaughlin returns for follow up for TN. She was last seen 04/2022. She reported discontinuing gabapentin  in 2020 as pain resolved but had a recent flare. Steroid taper prescribed and gabapentin  was restarted at 300mg  at bedtime. Since, she reports doing well until about a month ago. Sharp, stabbing, electrical pain radiating from right temporal area to right cheek. Worse with chewing or applying makeup. Heat seems to help. She increased gabapentin  to 300mg  BID that did seem to help. No drainage of eye or nose. No edema or significant tenderness of temporal region.   05/07/2022 ALL: Beverly Mclaughlin returns for follow up for right sided facial pain. She was last seen 09/2019. Gabapentin  was continued at 600mg  TID and carbamazepine  added. She reports taking meds for a few months but then discontinued as pain was resolved. She was doing well until three weeks ago. She reports pain returned over the right cheek. She reports pain radiates to the right eye and is sharp in nature. Pain  last about 40 seconds then resolves. She has difficulty chewing when pain is present. Face is sensitive when she is washing her face. No drainage of eye or nose. No edema or significant tenderness of temporal region.    10/14/2019 ALL:  Beverly Mclaughlin is a 53 y.o. female here today for follow up of right sided face pain. She was seen in 04/2017 as a new patient. She was started on gabapentin  and reports this worked very well. She discontinued gabapentin  about 12-18 months ago. In October, 2020, she had her teeth cleaned. She has had significant worsening of right jaw pain. It starts at right lower lip and radiates to her right ear. She saw her PCP for jaw and shoulder pain. She was started on gabapentin  600mg  TID and Tramadol  50mg  at night. She did not get any relief and has increased dose.  She is now taking 1800mg  TID (total 5400mg  daily). She does feel that she can get through the day at this dose but pain continues. She denies adverse effects with increased dose. She denies history of liver or kidney disease. MRI in 2018 showed white matter changes and T2 flair hyper intensities. Additional testing not consistent with MS. She had a repeat MRI in 2019 that did not show any changes.    HISTORY: (copied from Dr Jenel' note on 04/29/2017)   Beverly Mclaughlin is a 53 year old right-handed black female with a history of onset of right mid face discomfort  that began in March 2018. The patient had just had a filling done on a tooth in the right maxillary area a few days prior to onset of symptoms. The patient has noted some sharp jabbing pains ago from the right upper lip to the orbital area on the right. The patient indicates that chewing or talking may increase the frequency of the pain. The patient was seen back by her dentist and the tooth was filed down slightly without benefit, the patient then had an evaluation through an oral surgeon and her wisdom teeth were removed along with another molar but the pain  continued. The patient was seen by an endodontist, no dental abnormalities were noted. The patient was set up for a CT of the maxillofacial area which was unremarkable. The patient denies any numbness of the face, she has not had any weakness or numbness of extremities. At times she may have some slight imbalance, she denies issues controlling the bowels or the bladder. She has been placed on low-dose gabapentin  taking 200 mg daily, this may have started to benefit the pain slightly. She is sent to this office for further evaluation.     REVIEW OF SYSTEMS: Out of a complete 14 system review of symptoms, the patient complains only of the following symptoms, right sided facial pain and all other reviewed systems are negative.   ALLERGIES: Allergies      Allergies  Allergen Reactions   Prilosec [Omeprazole] Hives and Rash        HOME MEDICATIONS:       Outpatient Medications Prior to Visit  Medication Sig Dispense Refill   gabapentin  (NEURONTIN ) 300 MG capsule Take 1 capsule (300 mg total) by mouth at bedtime. 90 capsule 3   APPLE CIDER VINEGAR PO Take by mouth. 2 tablets once a day (Patient not taking: Reported on 07/02/2023)       ibuprofen  (ADVIL ) 800 MG tablet Take 1 tablet (800 mg total) by mouth every 8 (eight) hours as needed. (Patient not taking: Reported on 07/02/2023) 90 tablet 1   methocarbamol  (ROBAXIN ) 500 MG tablet Take 1 tablet (500 mg total) by mouth 3 (three) times daily. (Patient not taking: Reported on 07/02/2023) 60 tablet 1   Vitamin D , Ergocalciferol , (DRISDOL ) 1.25 MG (50000 UNIT) CAPS capsule Take 1 capsule (50,000 Units total) by mouth every 7 (seven) days. (Patient not taking: Reported on 07/02/2023) 5 capsule 2      No facility-administered medications prior to visit.        PAST MEDICAL HISTORY:     Past Medical History:  Diagnosis Date   Anemia     Facial pain     Trigeminal neuralgia 04/29/2017     Right V2          PAST SURGICAL HISTORY:      Past Surgical  History:  Procedure Laterality Date   ABDOMINAL HYSTERECTOMY   Jan 2017    -[facility]    TUBAL LIGATION              FAMILY HISTORY:      Family History  Problem Relation Age of Onset   Heart disease Mother     Diabetes Mother     Multiple myeloma Mother     Kidney disease Mother     Diabetes Father     Kidney disease Father     Hypertension Sister     Diabetes Sister     Kidney disease Maternal Uncle     Cancer Maternal  Grandmother     Kidney disease Maternal Grandmother     Breast cancer Maternal Grandmother     Diabetes Paternal Grandfather            SOCIAL HISTORY: Social History         Socioeconomic History   Marital status: Divorced      Spouse name: Not on file   Number of children: 2   Years of education: some college   Highest education level: Not on file  Occupational History   Occupation: Sports coach  Tobacco Use   Smoking status: Never   Smokeless tobacco: Never  Vaping Use   Vaping status: Never Used  Substance and Sexual Activity   Alcohol use: Yes      Alcohol/week: 2.0 standard drinks of alcohol      Types: 1 Glasses of wine, 1 Cans of beer per week      Comment: occasionally   Drug use: No   Sexual activity: Yes      Birth control/protection: Surgical  Other Topics Concern   Not on file  Social History Narrative    Lives at home with two sons.    Right-handed.    2 cups coffee daily.    Social Determinants of Health    Financial Resource Strain: Not on file  Food Insecurity: Not on file  Transportation Needs: Not on file  Physical Activity: Not on file  Stress: Not on file  Social Connections: Not on file  Intimate Partner Violence: Not on file          PHYSICAL EXAM      Vitals:    07/02/23 0828  BP: (!) 127/92  Pulse: 94  Weight: 292 lb 8 oz (132.7 kg)  Height: 5' 9 (1.753 m)        Body mass index is 43.19 kg/m.   Generalized: Well developed, in no acute distress  Cardiology: normal  rate and rhythm, no murmur noted Respiratory: clear to auscultation bilaterally  Neurological examination  Mentation: Alert oriented to time, place, history taking. Follows all commands speech and language fluent Cranial nerve II-XII: Pupils were equal round reactive to light. Extraocular movements were full, visual field were full on confrontational test. Facial sensation and strength were normal. Uvula tongue midline. Head turning and shoulder shrug  were normal and symmetric. Motor: The motor testing reveals 5 over 5 strength of all 4 extremities. Good symmetric motor tone is noted throughout.  Sensory: Sensory testing is intact to soft touch on all 4 extremities. No evidence of extinction is noted. Tenderness with palpation of right lower jaw line. No numbness  Gait and station: Gait is normal.    DIAGNOSTIC DATA (LABS, IMAGING, TESTING) - I reviewed patient records, labs, notes, testing and imaging myself where available.         No data to display             Recent Labs       Lab Results  Component Value Date    WBC 7.3 09/13/2022    HGB 12.0 09/13/2022    HCT 35.6 09/13/2022    MCV 93 09/13/2022    PLT 198 09/13/2022      Labs (Brief)          Component Value Date/Time    NA 138 09/13/2022 0807    K 4.3 09/13/2022 0807    CL 102 09/13/2022 0807    CO2 23 09/13/2022 0807    GLUCOSE 86 09/13/2022 0807  GLUCOSE 84 12/08/2020 0944    BUN 10 09/13/2022 0807    CREATININE 0.81 09/13/2022 0807    CREATININE 0.82 12/08/2020 0944    CALCIUM 9.4 09/13/2022 0807    PROT 6.7 09/13/2022 0807    ALBUMIN 4.1 09/13/2022 0807    AST 17 09/13/2022 0807    ALT 11 09/13/2022 0807    ALKPHOS 54 09/13/2022 0807    BILITOT 0.4 09/13/2022 0807    GFRNONAA 85 04/29/2017 1035    GFRNONAA >89 05/31/2015 0830    GFRAA 99 04/29/2017 1035    GFRAA >89 05/31/2015 0830      Recent Labs       Lab Results  Component Value Date    CHOL 150 09/13/2022    HDL 74 09/13/2022     LDLCALC 64 09/13/2022    TRIG 59 09/13/2022    CHOLHDL 2.0 09/13/2022      Recent Labs       Lab Results  Component Value Date    HGBA1C 5.1 09/13/2022      Recent Labs  No results found for: VITAMINB12   Recent Labs       Lab Results  Component Value Date    TSH 1.100 09/13/2022          ASSESSMENT AND PLAN  Trigeminal neuralgia - Plan: MR BRAIN/IAC W WO CONTRAST  Face pain  White matter abnormality on MRI of brain   During this flare take 300 mg oxcarbazepine  three times a day (and can go to 300 mg po qid).   Consider going back to twice daily oxcarbazepine  a couple weeks after symptoms improve.  When time for refill go up to the 300 mg pills #90 with 11 refills (bid baseline dose and increase to tid or qid during a flare).   Consider Vimpat if pain does not Improve MRI brain with thin cuts through pons to evaluate for vascular compression and f/u white matter changes if vascular compression is noted, consider referral for surgery.  This will also allow us  to follow-up on the white matter changes noted on the previous MRI.  They are most consistent with chronic microvascular ischemic change.  I do not think she has demyelination. Rtc  6 months

## 2024-05-20 ENCOUNTER — Telehealth: Payer: Self-pay | Admitting: Neurology

## 2024-05-20 NOTE — Telephone Encounter (Signed)
 I submitted a PA for the MRI to her insurance with our office notes and they sent back a request for missing info:  Missing Clinical: ANY of the following: Bilateral disease, dizziness/vertigo, hearing loss, pain duration > 2 min, pain outside TN distribution (eye/ear/head/mouth/sinus/tooth), pain worsening, sensory loss, face or upper extremity, or visual symptoms   If you can update her office visit note I can resend it thanks

## 2024-05-24 NOTE — Telephone Encounter (Signed)
 Office note resent to insurance.

## 2024-05-25 NOTE — Telephone Encounter (Signed)
 [employer] shara: [id number] exp. 05/19/24-06/18/24 sent to Zelda Salmon [phone]

## 2024-05-27 NOTE — Telephone Encounter (Signed)
 [employer] shara: [id number] exp. 05/27/24-07/11/24 for Beverly Mclaughlin.

## 2024-06-01 ENCOUNTER — Other Ambulatory Visit: Payer: Self-pay | Admitting: Neurology

## 2024-06-01 ENCOUNTER — Encounter: Payer: Self-pay | Admitting: Neurology

## 2024-06-01 ENCOUNTER — Ambulatory Visit (HOSPITAL_COMMUNITY)
Admission: RE | Admit: 2024-06-01 | Discharge: 2024-06-01 | Disposition: A | Discharge status: Home / Self Care | Source: Ambulatory Visit | Attending: Neurology | Admitting: Neurology

## 2024-06-01 DIAGNOSIS — G500 Trigeminal neuralgia: Secondary | ICD-10-CM | POA: Diagnosis present

## 2024-06-01 MED ORDER — BACLOFEN 10 MG PO TABS: 10.0000 mg | ORAL_TABLET | Freq: Three times a day (TID) | ORAL | 2 refills | Status: AC

## 2024-06-01 MED ORDER — GADOBUTROL 1 MMOL/ML IV SOLN
10.0000 mL | Freq: Once | INTRAVENOUS | Status: AC | PRN
  Administered 2024-06-01: 10 mL via INTRAVENOUS

## 2024-06-06 ENCOUNTER — Other Ambulatory Visit: Payer: Self-pay | Admitting: Medical Genetics

## 2024-06-08 ENCOUNTER — Telehealth: Payer: Self-pay | Admitting: Neurology

## 2024-06-08 DIAGNOSIS — G500 Trigeminal neuralgia: Secondary | ICD-10-CM

## 2024-06-08 DIAGNOSIS — R519 Headache, unspecified: Secondary | ICD-10-CM

## 2024-06-08 DIAGNOSIS — D329 Benign neoplasm of meninges, unspecified: Secondary | ICD-10-CM

## 2024-06-08 NOTE — Telephone Encounter (Signed)
 I spoke to the patient about the results of the MRI of the brain with thin cuts.  It shows irregularity within the right Meckel's cave with loss of fluid around the V3 and V2 segments of the trigeminal nerve.  This could be due to meningioma.  I discussed this with the patient that it does give a good explanation for her V3 greater than V2 pain.  I am going [facility].  I am not sure if this is amenable to surgery or gamma knife.  In the meantime, we will go up on the oxcarbazepine  to 600 mg twice a day.  The increase from 300 mg twice a day to 3 times a day has helped a little bit.  The MRI of the brain also showed white matter changes consistent with chronic microvascular ischemic change.  This is just slightly increased compared to 2019

## 2024-06-09 ENCOUNTER — Telehealth: Payer: Self-pay | Admitting: Neurology

## 2024-06-09 NOTE — Telephone Encounter (Signed)
 Referral for neurosurgery fax to [facility]. Phone: [phone], Fax: [phone]

## 2024-06-14 ENCOUNTER — Other Ambulatory Visit (HOSPITAL_COMMUNITY)

## 2024-07-01 ENCOUNTER — Ambulatory Visit: Payer: Managed Care, Other (non HMO) | Admitting: Family Medicine

## 2024-07-18 ENCOUNTER — Encounter: Payer: Self-pay | Admitting: Neurology

## 2024-07-19 ENCOUNTER — Other Ambulatory Visit: Payer: Self-pay

## 2024-07-20 ENCOUNTER — Other Ambulatory Visit: Payer: Self-pay

## 2024-07-22 ENCOUNTER — Other Ambulatory Visit: Payer: Self-pay | Admitting: Neurology

## 2024-07-22 MED ORDER — OXCARBAZEPINE 600 MG PO TABS: 600.0000 mg | ORAL_TABLET | Freq: Three times a day (TID) | ORAL | 0 refills | Status: DC

## 2024-07-22 NOTE — Telephone Encounter (Signed)
 Oxcarbazepine  600 mg three times daily sent to pharmacy.

## 2024-07-24 ENCOUNTER — Other Ambulatory Visit: Payer: Self-pay | Admitting: Neurology

## 2024-07-27 ENCOUNTER — Other Ambulatory Visit (HOSPITAL_COMMUNITY): Payer: Self-pay

## 2024-07-27 ENCOUNTER — Telehealth: Payer: Self-pay | Admitting: *Deleted

## 2024-07-27 NOTE — Telephone Encounter (Signed)
 Sounds like PA is needed for the below medication. Thanks!

## 2024-07-27 NOTE — Telephone Encounter (Signed)
 Pharmacy Patient Advocate Encounter   Received notification from Physician's Office that prior authorization for Oxcarbazepine  600mg  Tablet is required/requested.   Insurance verification completed.   The patient is insured through [facility] .   Per test claim: PA required; PA submitted to above mentioned insurance via Latent Key/confirmation #/EOC [id number] Status is pending

## 2024-07-28 ENCOUNTER — Other Ambulatory Visit (HOSPITAL_COMMUNITY): Payer: Self-pay

## 2024-07-28 NOTE — Telephone Encounter (Addendum)
 SABRA

## 2024-07-28 NOTE — Telephone Encounter (Signed)
 I tried to call [facility] but was on hold for was more than 3 callers in fornt of me-so I faxed the PA cancellation letter to [facility] and asked them to do as letter asks and for the pharmacy to call the [facility] pharmacy help desk to process RX.

## 2024-08-19 ENCOUNTER — Other Ambulatory Visit: Payer: Self-pay | Admitting: Medical Genetics

## 2024-08-19 DIAGNOSIS — Z006 Encounter for examination for normal comparison and control in clinical research program: Secondary | ICD-10-CM

## 2024-08-20 ENCOUNTER — Encounter (INDEPENDENT_AMBULATORY_CARE_PROVIDER_SITE_OTHER): Payer: Self-pay | Admitting: *Deleted

## 2024-09-17 ENCOUNTER — Other Ambulatory Visit: Payer: Self-pay

## 2024-09-28 LAB — GENECONNECT MOLECULAR SCREEN: Genetic Analysis Overall Interpretation: NEGATIVE

## 2024-09-30 ENCOUNTER — Encounter: Payer: Self-pay | Admitting: Neurology

## 2024-10-01 ENCOUNTER — Other Ambulatory Visit (HOSPITAL_COMMUNITY): Payer: Self-pay

## 2024-10-01 ENCOUNTER — Other Ambulatory Visit: Payer: Self-pay | Admitting: Neurology

## 2024-10-01 ENCOUNTER — Telehealth: Payer: Self-pay

## 2024-10-01 MED ORDER — OXCARBAZEPINE 600 MG PO TABS: ORAL_TABLET | ORAL | 3 refills | Status: AC

## 2024-10-01 NOTE — Telephone Encounter (Signed)
 Pharmacy Patient Advocate Encounter   Received notification from Patient Advice Request messages that prior authorization for OXcarbazepine  is required/requested.    Insurance verification completed.   The patient is insured through [facility].   Per test claim: PA required; PA started via CoverMyMeds. KEY [id number] . Waiting for clinical questions to populate.

## 2024-10-01 NOTE — Telephone Encounter (Signed)
 Clinical questions have been answered and PA submitted. PA currently Pending. Please be advised that most companies allow up to 30 days to make a decision. We will advise when a determination has been made, or follow up in 1 week.   Please reach out to our team, Rx Prior Auth Pool, if you haven't heard back in a week.

## 2024-11-11 ENCOUNTER — Other Ambulatory Visit: Payer: Self-pay | Admitting: *Deleted

## 2024-11-11 MED ORDER — GABAPENTIN 300 MG PO CAPS: 600.0000 mg | ORAL_CAPSULE | Freq: Three times a day (TID) | ORAL | 0 refills | Status: AC

## 2024-11-11 NOTE — Telephone Encounter (Signed)
 Last seen on 05/18/24 Follow up scheduled on 12/22/24

## 2024-12-22 ENCOUNTER — Ambulatory Visit: Admitting: Neurology
